# Patient Record
Sex: Female | Born: 1970 | Race: Black or African American | Hispanic: No | Marital: Single | Smoking: Never smoker
Health system: Southern US, Community
[De-identification: ages and names within clinical notes are randomized; demographics above are authoritative.]

## PROBLEM LIST (undated history)

## (undated) DIAGNOSIS — F32A Depression, unspecified: Secondary | ICD-10-CM

## (undated) DIAGNOSIS — I10 Essential (primary) hypertension: Secondary | ICD-10-CM

## (undated) DIAGNOSIS — B20 Human immunodeficiency virus [HIV] disease: Secondary | ICD-10-CM

## (undated) DIAGNOSIS — F329 Major depressive disorder, single episode, unspecified: Secondary | ICD-10-CM

## (undated) HISTORY — PX: OTHER SURGICAL HISTORY: SHX169

---

## 2011-02-11 DIAGNOSIS — N051 Unspecified nephritic syndrome with focal and segmental glomerular lesions: Secondary | ICD-10-CM

## 2011-02-11 HISTORY — DX: Unspecified nephritic syndrome with focal and segmental glomerular lesions: N05.1

## 2011-08-08 ENCOUNTER — Emergency Department: Payer: Self-pay | Admitting: Emergency Medicine

## 2014-07-21 ENCOUNTER — Emergency Department
Admission: EM | Admit: 2014-07-21 | Discharge: 2014-07-22 | Disposition: A | Discharge status: Home / Self Care | Payer: Self-pay | Attending: Student | Admitting: Student

## 2014-07-21 ENCOUNTER — Encounter: Payer: Self-pay | Admitting: Emergency Medicine

## 2014-07-21 DIAGNOSIS — Z79899 Other long term (current) drug therapy: Secondary | ICD-10-CM | POA: Insufficient documentation

## 2014-07-21 DIAGNOSIS — Z3202 Encounter for pregnancy test, result negative: Secondary | ICD-10-CM | POA: Insufficient documentation

## 2014-07-21 DIAGNOSIS — F329 Major depressive disorder, single episode, unspecified: Secondary | ICD-10-CM | POA: Insufficient documentation

## 2014-07-21 DIAGNOSIS — F32A Depression, unspecified: Secondary | ICD-10-CM

## 2014-07-21 HISTORY — DX: Major depressive disorder, single episode, unspecified: F32.9

## 2014-07-21 HISTORY — DX: Depression, unspecified: F32.A

## 2014-07-21 HISTORY — DX: Human immunodeficiency virus (HIV) disease: B20

## 2014-07-21 LAB — COMPREHENSIVE METABOLIC PANEL
ALT: 27 U/L (ref 14–54)
AST: 36 U/L (ref 15–41)
Albumin: 4.1 g/dL (ref 3.5–5.0)
Alkaline Phosphatase: 75 U/L (ref 38–126)
Anion gap: 9 (ref 5–15)
BUN: 10 mg/dL (ref 6–20)
CO2: 22 mmol/L (ref 22–32)
Calcium: 9.2 mg/dL (ref 8.9–10.3)
Chloride: 108 mmol/L (ref 101–111)
Creatinine, Ser: 1.03 mg/dL — ABNORMAL HIGH (ref 0.44–1.00)
GFR calc Af Amer: >60 mL/min (ref >60)
GFR calc non Af Amer: >60 mL/min (ref >60)
Glucose, Bld: 103 mg/dL — ABNORMAL HIGH (ref 65–99)
Potassium: 3 mmol/L — ABNORMAL LOW (ref 3.5–5.1)
Sodium: 139 mmol/L (ref 135–145)
Total Bilirubin: 0.6 mg/dL (ref 0.3–1.2)
Total Protein: 8.5 g/dL — ABNORMAL HIGH (ref 6.5–8.1)

## 2014-07-21 LAB — CBC
HCT: 41.4 % (ref 35.0–47.0)
Hemoglobin: 14.4 g/dL (ref 12.0–16.0)
MCH: 34.6 pg — ABNORMAL HIGH (ref 26.0–34.0)
MCHC: 34.9 g/dL (ref 32.0–36.0)
MCV: 99.1 fL (ref 80.0–100.0)
Platelets: 262 10*3/uL (ref 150–440)
RBC: 4.17 MIL/uL (ref 3.80–5.20)
RDW: 13.6 % (ref 11.5–14.5)
WBC: 6.9 10*3/uL (ref 3.6–11.0)

## 2014-07-21 LAB — URINE DRUG SCREEN, QUALITATIVE (ARMC ONLY)
Amphetamines, Ur Screen: NOT DETECTED
Barbiturates, Ur Screen: NOT DETECTED
Benzodiazepine, Ur Scrn: NOT DETECTED
Cannabinoid 50 Ng, Ur ~~LOC~~: NOT DETECTED
Cocaine Metabolite,Ur ~~LOC~~: NOT DETECTED
MDMA (Ecstasy)Ur Screen: NOT DETECTED
Methadone Scn, Ur: NOT DETECTED
Opiate, Ur Screen: NOT DETECTED
Phencyclidine (PCP) Ur S: NOT DETECTED
Tricyclic, Ur Screen: NOT DETECTED

## 2014-07-21 LAB — SALICYLATE LEVEL: Salicylate Lvl: <4 mg/dL (ref 2.8–30.0)

## 2014-07-21 LAB — ETHANOL: Alcohol, Ethyl (B): <5 mg/dL (ref <5)

## 2014-07-21 LAB — ACETAMINOPHEN LEVEL: Acetaminophen (Tylenol), Serum: <10 ug/mL — ABNORMAL LOW (ref 10–30)

## 2014-07-21 NOTE — ED Notes (Addendum)
BEHAVIORAL HEALTH ROUNDING  Patient sleeping: NO Patient alert and oriented: YES  Behavior appropriate: YES.; If no, describe:  Nutrition and fluids offered: YES Toileting and hygiene offered: YES Sitter present: YES q 15 min. checks  Law enforcement present: YES

## 2014-07-21 NOTE — ED Notes (Signed)
Patient has been having suicidal and homicidal thoughts for months. States that yesterday she almost hurt herself by cutting herself. Patient states that she has a hx of depression and that she has been off her behavioral health meds for years.

## 2014-07-21 NOTE — ED Notes (Signed)
BEHAVIORAL HEALTH ROUNDING  Patient sleeping: YES Patient alert and oriented: NO Behavior appropriate: NO ; If no, describe:  Nutrition and fluids offered: NO  Toileting and hygiene offered: NO Sitter present: YES  Law enforcement present: YES  

## 2014-07-21 NOTE — ED Notes (Signed)
ENVIRONMENTAL ASSESSMENT  Potentially harmful objects out of patient reach: YES  Personal belongings secured: YES  Patient dressed in hospital provided attire: YES  Plastic bags out of patient reach: YES  

## 2014-07-21 NOTE — ED Notes (Signed)
Pt assigned to appropriate care area. Pt oriented to unit/care:informed that, for their safety, care areas are designed for safety and monitored by security cameras at all times; and visiting hours explained to pt.   

## 2014-07-22 LAB — PREGNANCY, URINE: Preg Test, Ur: NEGATIVE

## 2014-07-22 MED ORDER — POTASSIUM CHLORIDE CRYS ER 20 MEQ PO TBCR
EXTENDED_RELEASE_TABLET | ORAL | Status: AC
  Administered 2014-07-22: 20 meq via ORAL
  Filled 2014-07-22: qty 1

## 2014-07-22 MED ORDER — SERTRALINE HCL 50 MG PO TABS
50.0000 mg | ORAL_TABLET | Freq: Every day | ORAL | Status: DC
Start: 2014-07-22 — End: 2014-07-22

## 2014-07-22 MED ORDER — POTASSIUM CHLORIDE CRYS ER 20 MEQ PO TBCR
20.0000 meq | EXTENDED_RELEASE_TABLET | Freq: Once | ORAL | Status: AC
  Administered 2014-07-22: 20 meq via ORAL

## 2014-07-22 NOTE — ED Notes (Signed)
BEHAVIORAL HEALTH ROUNDING Patient sleeping: No. Patient alert and oriented: yes Behavior appropriate: Yes.  ; If no, describe:  Nutrition and fluids offered: yes Toileting and hygiene offered: Yes  Sitter present: q15 minute observations and security camera monitoring Law enforcement present: Yes  ODS  

## 2014-07-22 NOTE — ED Notes (Signed)
BEHAVIORAL HEALTH ROUNDING  Patient sleeping: Yes.  Patient alert and oriented: no  Behavior appropriate: Yes. ; If no, describe:  Nutrition and fluids offered: No  Toileting and hygiene offered: No  Sitter present: no  Law enforcement present: Yes   

## 2014-07-22 NOTE — BH Assessment (Signed)
Assessment Note  Ariel Bailey is an 44 y.o. female, who presents to the ED via a friend for c/o, "I'm having suicidal thoughts; no plan; "I have been feeling like this for a few months; I ran out of my medicine; I only take my HIV medicine; I get from [facility]. "I never had treatment before; my moods are up and down; I get mad easy; it's time for me to get some help."  Axis I: Major Depression, single episode Axis II: Deferred Axis III:  Past Medical History  Diagnosis Date  . Depression   . HIV disease    Axis IV: other psychosocial or environmental problems, problems with access to health care services and problems with primary support group Axis V: 11-20 some danger of hurting self or others possible OR occasionally fails to maintain minimal personal hygiene OR gross impairment in communication  Past Medical History:  Past Medical History  Diagnosis Date  . Depression   . HIV disease     Past Surgical History  Procedure Laterality Date  . Pelvic mass removal      Family History: No family history on file.  Social History:  reports that she has never smoked. She does not have any smokeless tobacco history on file. She reports that she does not drink alcohol or use illicit drugs.  Additional Social History:     CIWA: CIWA-Ar BP: (!) 140/91 mmHg Pulse Rate: 68 COWS:    Allergies: No Known Allergies  Home Medications:  (Not in a hospital admission)  OB/GYN Status:  No LMP recorded. Patient is not currently having periods (Reason: IUD).  General Assessment Data Location of Assessment: [facility] ED TTS Assessment: In system Is this a Tele or Face-to-Face Assessment?: Face-to-Face Is this an Initial Assessment or a Re-assessment for this encounter?: Re-Assessment Marital status: Single Maiden name: none Is patient pregnant?: No Pregnancy Status: No Living Arrangements: Spouse/significant other Can pt return to current living arrangement?: Yes Admission  Status: Involuntary Is patient capable of signing voluntary admission?: Yes Referral Source: Self/Family/Friend Insurance type: none  Medical Screening Exam [facility] Walk-in ONLY) Medical Exam completed: Yes  Crisis Care Plan Living Arrangements: Spouse/significant other Name of Psychiatrist: none Name of Therapist: none  Education Status Is patient currently in school?: No Current Grade: n/a Highest grade of school patient has completed: 12th Name of school: n/a Contact person: roommate  Risk to self with the past 6 months Suicidal Ideation: Yes-Currently Present Has patient been a risk to self within the past 6 months prior to admission? : No Suicidal Intent: Yes-Currently Present Has patient had any suicidal intent within the past 6 months prior to admission? : No Is patient at risk for suicide?: Yes Suicidal Plan?: No Has patient had any suicidal plan within the past 6 months prior to admission? : No Access to Means: No What has been your use of drugs/alcohol within the last 12 months?: none Previous Attempts/Gestures: No How many times?: 0 Other Self Harm Risks: helpless; hopeless Triggers for Past Attempts: None known Intentional Self Injurious Behavior: None Family Suicide History: No Recent stressful life event(s): Conflict (Comment) Persecutory voices/beliefs?: No Depression: Yes Depression Symptoms: Despondent, Tearfulness, Insomnia, Loss of interest in usual pleasures Substance abuse history and/or treatment for substance abuse?: No Suicide prevention information given to non-admitted patients: Yes  Risk to Others within the past 6 months Homicidal Ideation: No Does patient have any lifetime risk of violence toward others beyond the six months prior to admission? : No Thoughts  of Harm to Others: No Current Homicidal Intent: No Current Homicidal Plan: No Access to Homicidal Means: No Identified Victim: none History of harm to others?: No Assessment of  Violence: On admission Violent Behavior Description: none Does patient have access to weapons?: No Criminal Charges Pending?: No Does patient have a court date: No Is patient on probation?: No  Psychosis Hallucinations: None noted Delusions: None noted  Mental Status Report Appearance/Hygiene: In scrubs, Disheveled Eye Contact: Poor Motor Activity: Unremarkable Speech: Slow, Soft Level of Consciousness: Sleeping Mood: Helpless, Sad Affect: Depressed, Sad Anxiety Level: None Thought Processes: Coherent, Circumstantial Judgement: Partial Orientation: Person, Place, Situation Obsessive Compulsive Thoughts/Behaviors: None  Cognitive Functioning Concentration: Fair Memory: Recent Intact, Remote Intact IQ: Average Insight: Fair Impulse Control: Fair Appetite: Fair Weight Loss: 0 Weight Gain: 0 Sleep: Decreased Total Hours of Sleep: 4 Vegetative Symptoms: None  ADLScreening [facility] Assessment Services) Patient's cognitive ability adequate to safely complete daily activities?: Yes Patient able to express need for assistance with ADLs?: Yes Independently performs ADLs?: Yes (appropriate for developmental age)  Prior Inpatient Therapy Prior Inpatient Therapy: No  Prior Outpatient Therapy Prior Outpatient Therapy: No Does patient have an ACCT team?: No Does patient have Intensive In-House Services?  : No Does patient have [facility] services? : No Does patient have P4CC services?: No  ADL Screening (condition at time of admission) Patient's cognitive ability adequate to safely complete daily activities?: Yes Patient able to express need for assistance with ADLs?: Yes Independently performs ADLs?: Yes (appropriate for developmental age)       Abuse/Neglect Assessment (Assessment to be complete while patient is alone) Physical Abuse: Denies Verbal Abuse: Denies Sexual Abuse: Denies Exploitation of patient/patient's resources: Denies Self-Neglect: Denies Values /  Beliefs Cultural Requests During Hospitalization: None Spiritual Requests During Hospitalization: None Consults Spiritual Care Consult Needed: No Social Work Consult Needed: No Merchant navy officer (For Healthcare) Does patient have an advance directive?: No Would patient like information on creating an advanced directive?: Yes English as a second language teacher given    Additional Information 1:1 In Past 12 Months?: No CIRT Risk: No Elopement Risk: No Does patient have medical clearance?: Yes  Child/Adolescent Assessment Running Away Risk: Denies Bed-Wetting: Denies Destruction of Property: Denies Cruelty to Animals: Denies Stealing: Denies Rebellious/Defies Authority: Denies Satanic Involvement: Denies Archivist: Denies Problems at [employer]: Denies Gang Involvement: Denies  Disposition:  Disposition Initial Assessment Completed for this Encounter: Yes Disposition of Patient: Referred to (Psych MD to see) Patient referred to: Other (Comment) (Consult)  On Site Evaluation by:   Reviewed with Physician:    Dwan Bolt 07/22/2014 1:36 AM

## 2014-07-22 NOTE — ED Notes (Signed)
Pt observed lying in bed with her TV on   Appropriate to stimulation  No verbalized needs or concerns at this time  NAD assessed  Continue to monitor

## 2014-07-22 NOTE — ED Provider Notes (Signed)
-----------------------------------------   4:56 PM on 07/22/2014 -----------------------------------------  The patient has been seen and evaluated by psychiatry, they believe that the patient is safe for discharge home at this time. Patient will be discharged home with normal psychiatric return precautions.  Minna Antis, MD 07/22/14 [phone]

## 2014-07-22 NOTE — ED Notes (Signed)
BEHAVIORAL HEALTH ROUNDING Patient sleeping: No. Patient alert and oriented: yes Behavior appropriate: Yes.  ; If no, describe:  Nutrition and fluids offered: Yes  Toileting and hygiene offered: Yes  Sitter present: yes Law enforcement present: Yes  

## 2014-07-22 NOTE — ED Notes (Signed)
Patient is going to D/C to home

## 2014-07-22 NOTE — ED Notes (Signed)

## 2014-07-22 NOTE — ED Notes (Signed)

## 2014-07-22 NOTE — ED Notes (Signed)
Pt to discharge to home   1/1 bags of belongings returned to her and she verbalized that she received back all belongings that she came here with   Discharge instructions reviewed with her and she verbalized agreement and understanding

## 2014-07-22 NOTE — ED Notes (Addendum)
Supper provided along with an extra drink  Pt observed with no unusual behavior  Appropriate to stimulation  No verbalized needs or concerns at this time  NAD assessed  Continue to monitor 

## 2014-07-22 NOTE — ED Notes (Signed)

## 2014-07-22 NOTE — Discharge Instructions (Signed)
Depression Depression is feeling sad, low, down in the dumps, blue, gloomy, or empty. In general, there are two kinds of depression:  Normal sadness or grief. This can happen after something upsetting. It often goes away on its own within 2 weeks. After losing a loved one (bereavement), normal sadness and grief may last longer than two weeks. It usually gets better with time.  Clinical depression. This kind lasts longer than normal sadness or grief. It keeps you from doing the things you normally do in life. It is often hard to function at home, work, or at school. It may affect your relationships with others. Treatment is often needed. GET HELP RIGHT AWAY IF:  You have thoughts about hurting yourself or others.  You lose touch with reality (psychotic symptoms). You may:  See or hear things that are not real.  Have untrue beliefs about your life or people around you.  Your medicine is giving you problems. MAKE SURE YOU:  Understand these instructions.  Will watch your condition.  Will get help right away if you are not doing well or get worse. Document Released: 03/01/2010 Document Revised: 06/13/2013 Document Reviewed: 05/29/2011 [facility] Patient Information 2015 [place], [place]. This information is not intended to replace advice given to you by your health care provider. Make sure you discuss any questions you have with your health care provider.   Please return to the emergency department if you're having any thoughts of hurting herself or anyone else that we may attempt to help you.

## 2014-07-22 NOTE — ED Notes (Signed)
ED BHU PLACEMENT JUSTIFICATION Is the patient under IVC or is there intent for IVC: Yes.   Is the patient medically cleared: Yes.   Is there vacancy in the ED BHU: Yes.   Is the population mix appropriate for patient: Yes.   Is the patient awaiting placement in inpatient or outpatient setting: Yes.   Has the patient had a psychiatric consult: No. Survey of unit performed for contraband, proper placement and condition of furniture, tampering with fixtures in bathroom, shower, and each patient room: Yes.  ; Findings:  APPEARANCE/BEHAVIOR calm, cooperative and adequate rapport can be established NEURO ASSESSMENT Orientation: time, place and person Hallucinations: No.None noted (Hallucinations) Speech: Normal Gait: normal RESPIRATORY ASSESSMENT Normal expansion.  Clear to auscultation.  No rales, rhonchi, or wheezing. CARDIOVASCULAR ASSESSMENT regular rate and rhythm, S1, S2 normal, no murmur, click, rub or gallop GASTROINTESTINAL ASSESSMENT soft, nontender, BS WNL, no r/g EXTREMITIES normal strength, tone, and muscle mass PLAN OF CARE Provide calm/safe environment. Vital signs assessed twice daily. ED BHU Assessment once each 12-hour shift. Collaborate with intake RN daily or as condition indicates. Assure the ED provider has rounded once each shift. Provide and encourage hygiene. Provide redirection as needed. Assess for escalating behavior; address immediately and inform ED provider.  Assess family dynamic and appropriateness for visitation as needed: Yes.  ; If necessary, describe findings:  Educate the patient/family about BHU procedures/visitation: Yes.  ; If necessary, describe findings:  

## 2014-07-22 NOTE — ED Notes (Signed)
Pt is currently getting in to the shower

## 2014-07-22 NOTE — ED Notes (Signed)
Patient assigned to appropriate care area. Patient oriented to unit/care area: Informed that, for their safety, care areas are designed for safety and monitored by security cameras at all times; and visiting hours explained to patient. Patient verbalizes understanding, and verbal contract for safety obtained. 

## 2014-07-22 NOTE — Consult Note (Signed)
[facility] Consult   Reason for Consult:  Follow up Referring Physician:  ER Patient Identification: Ariel Bailey MRN:  [id number] Principal Diagnosis: MDD single episode Diagnosis:  There are no active problems to display for this patient.   Total Time spent with patient: 45 minutes  Subjective:   Ariel Bailey is a 44 y.o. female patient admitted with a long H/O feeling low and down about life in general. Pt lives with a room mate who called for help.  HPI:  Feeling low and down and no previous inpt to Psychiatry and denies alcohol or drug abuse. HPI Elements:     Past Medical History:  Past Medical History  Diagnosis Date  . Depression   . HIV disease     Past Surgical History  Procedure Laterality Date  . Pelvic mass removal     Family History: No family history on file. Social History:  History  Alcohol Use No     History  Drug Use No    History   Social History  . Marital Status: Single    Spouse Name: N/A  . Number of Children: N/A  . Years of Education: N/A   Social History Main Topics  . Smoking status: Never Smoker   . Smokeless tobacco: Not on file  . Alcohol Use: No  . Drug Use: No  . Sexual Activity: Not on file   Other Topics Concern  . None   Social History Narrative  . None   Additional Social History:                          Allergies:  No Known Allergies  Labs:  Results for orders placed or performed during the hospital encounter of 07/21/14 (from the past 48 hour(s))  Urine Drug Screen, Qualitative ([facility] only)     Status: None   Collection Time: 07/21/14  6:20 PM  Result Value Ref Range   Tricyclic, Ur Screen NONE DETECTED NONE DETECTED   Amphetamines, Ur Screen NONE DETECTED NONE DETECTED   MDMA (Ecstasy)Ur Screen NONE DETECTED NONE DETECTED   Cocaine Metabolite,Ur [place] NONE DETECTED NONE DETECTED   Opiate, Ur Screen NONE DETECTED NONE DETECTED   Phencyclidine  (PCP) Ur S NONE DETECTED NONE DETECTED   Cannabinoid 50 Ng, Ur  NONE DETECTED NONE DETECTED   Barbiturates, Ur Screen NONE DETECTED NONE DETECTED   Benzodiazepine, Ur Scrn NONE DETECTED NONE DETECTED   Methadone Scn, Ur NONE DETECTED NONE DETECTED    Comment: (NOTE) 272  Tricyclics, urine               Cutoff 1000 ng/mL 200  Amphetamines, urine             Cutoff 1000 ng/mL 300  MDMA (Ecstasy), urine           Cutoff 500 ng/mL 400  Cocaine Metabolite, urine       Cutoff 300 ng/mL 500  Opiate, urine                   Cutoff 300 ng/mL 600  Phencyclidine (PCP), urine      Cutoff 25 ng/mL 700  Cannabinoid, urine              Cutoff 50 ng/mL 800  Barbiturates, urine             Cutoff 200 ng/mL 900  Benzodiazepine, urine  Cutoff 200 ng/mL 1000 Methadone, urine                Cutoff 300 ng/mL 1100 1200 The urine drug screen provides only a preliminary, unconfirmed 1300 analytical test result and should not be used for non-medical 1400 purposes. Clinical consideration and professional judgment should 1500 be applied to any positive drug screen result due to possible 1600 interfering substances. A more specific alternate chemical method 1700 must be used in order to obtain a confirmed analytical result.  1800 Gas chromato graphy / mass spectrometry (GC/MS) is the preferred 1900 confirmatory method.   Pregnancy, urine     Status: None   Collection Time: 07/21/14  6:20 PM  Result Value Ref Range   Preg Test, Ur NEGATIVE NEGATIVE  Acetaminophen level     Status: Abnormal   Collection Time: 07/21/14  7:21 PM  Result Value Ref Range   Acetaminophen (Tylenol), Serum <10 (L) 10 - 30 ug/mL    Comment:        THERAPEUTIC CONCENTRATIONS VARY SIGNIFICANTLY. A RANGE OF 10-30 ug/mL MAY BE AN EFFECTIVE CONCENTRATION FOR MANY PATIENTS. HOWEVER, SOME ARE BEST TREATED AT CONCENTRATIONS OUTSIDE THIS RANGE. ACETAMINOPHEN CONCENTRATIONS >150 ug/mL AT 4 HOURS AFTER INGESTION AND >50 ug/mL  AT 12 HOURS AFTER INGESTION ARE OFTEN ASSOCIATED WITH TOXIC REACTIONS.   CBC     Status: Abnormal   Collection Time: 07/21/14  7:21 PM  Result Value Ref Range   WBC 6.9 3.6 - 11.0 K/uL   RBC 4.17 3.80 - 5.20 MIL/uL   Hemoglobin 14.4 12.0 - 16.0 g/dL   HCT 41.4 35.0 - 47.0 %   MCV 99.1 80.0 - 100.0 fL   MCH 34.6 (H) 26.0 - 34.0 pg   MCHC 34.9 32.0 - 36.0 g/dL   RDW 13.6 11.5 - 14.5 %   Platelets 262 150 - 440 K/uL  Comprehensive metabolic panel     Status: Abnormal   Collection Time: 07/21/14  7:21 PM  Result Value Ref Range   Sodium 139 135 - 145 mmol/L   Potassium 3.0 (L) 3.5 - 5.1 mmol/L   Chloride 108 101 - 111 mmol/L   CO2 22 22 - 32 mmol/L   Glucose, Bld 103 (H) 65 - 99 mg/dL   BUN 10 6 - 20 mg/dL   Creatinine, Ser 1.03 (H) 0.44 - 1.00 mg/dL   Calcium 9.2 8.9 - 10.3 mg/dL   Total Protein 8.5 (H) 6.5 - 8.1 g/dL   Albumin 4.1 3.5 - 5.0 g/dL   AST 36 15 - 41 U/L   ALT 27 14 - 54 U/L   Alkaline Phosphatase 75 38 - 126 U/L   Total Bilirubin 0.6 0.3 - 1.2 mg/dL   GFR calc non Af Amer >60 >60 mL/min   GFR calc Af Amer >60 >60 mL/min    Comment: (NOTE) The eGFR has been calculated using the CKD EPI equation. This calculation has not been validated in all clinical situations. eGFR's persistently <60 mL/min signify possible Chronic Kidney Disease.    Anion gap 9 5 - 15  Ethanol (ETOH)     Status: None   Collection Time: 07/21/14  7:21 PM  Result Value Ref Range   Alcohol, Ethyl (B) <5 <5 mg/dL    Comment:        LOWEST DETECTABLE LIMIT FOR SERUM ALCOHOL IS 5 mg/dL FOR MEDICAL PURPOSES ONLY   Salicylate level     Status: None   Collection Time: 07/21/14  7:21 PM  Result Value Ref Range   Salicylate Lvl <5.0 2.8 - 30.0 mg/dL    Vitals: Blood pressure 140/91, pulse 68, temperature 97.7 F (36.5 C), temperature source Oral, resp. rate 12, height _0  (1.575 m), weight 76.204 kg (168 lb), SpO2 93 %.  Risk to Self: Suicidal Ideation: Yes-Currently Present Suicidal  Intent: Yes-Currently Present Is patient at risk for suicide?: Yes Suicidal Plan?: No Access to Means: No What has been your use of drugs/alcohol within the last 12 months?: none How many times?: 0 Other Self Harm Risks: helpless; hopeless Triggers for Past Attempts: None known Intentional Self Injurious Behavior: None Risk to Others: Homicidal Ideation: No Thoughts of Harm to Others: No Current Homicidal Intent: No Current Homicidal Plan: No Access to Homicidal Means: No Identified Victim: none History of harm to others?: No Assessment of Violence: On admission Violent Behavior Description: none Does patient have access to weapons?: No Criminal Charges Pending?: No Does patient have a court date: No Prior Inpatient Therapy: Prior Inpatient Therapy: No Prior Outpatient Therapy: Prior Outpatient Therapy: No Does patient have an ACCT team?: No Does patient have Intensive In-House Services?  : No Does patient have [facility] services? : No Does patient have P4CC services?: No  No current facility-administered medications for this encounter.   Current Outpatient Prescriptions  Medication Sig Dispense Refill  . TRIUMEQ 600-50-300 MG TABS Take 1 tablet by mouth daily.  11    Musculoskeletal: Strength & Muscle Tone: within normal limits Gait & Station: normal Patient leans: N/A  Psychiatric Specialty Exam: Physical Exam  Review of Systems  Constitutional: Negative.   HENT: Negative.   Eyes: Negative.   Respiratory: Negative.   Cardiovascular: Negative.   Gastrointestinal: Negative.   Genitourinary: Negative.   Musculoskeletal: Negative.   Skin: Negative.   Endo/Heme/Allergies: Negative.   Psychiatric/Behavioral: Positive for depression.  All other systems reviewed and are negative.   Blood pressure 140/91, pulse 68, temperature 97.7 F (36.5 C), temperature source Oral, resp. rate 12, height _1  (1.575 m), weight 76.204 kg (168 lb), SpO2 93 %.Body mass index is 30.72  kg/(m^2).  General Appearance: Casual  Eye Contact::  Fair  Speech:  Clear and Coherent  Volume:  Normal  Mood:  Anxious and Depressed  Affect:  Appropriate  Thought Process:  Coherent  Orientation:  Full (Time, Place, and Person)  Thought Content:  WDL  Suicidal Thoughts:  No  Homicidal Thoughts:  No  Memory:  Immediate;   Fair Recent;   Fair Remote;   Fair adequate  Judgement:  Fair  Insight:  Fair  Psychomotor Activity:  Normal  Concentration:  Fair  Recall:  [employer] [place]  Language: Fair  Akathisia:  No  Handed:  Right  AIMS (if indicated):     Assets:  Wellsite geologist  ADL's:  Intact  Cognition: WNL  Sleep:      Medical Decision Making: Established Problem, Stable/Improving (1)  Treatment Plan Summary: Plan start pt on Zoloft 50 mgs po daily with good and D/C pt with follow up at Out pt [facility].  Plan:  No evidence of imminent risk to self or others at present.   Disposition: as above.  Dewain Penning 07/22/2014 4:25 PM

## 2014-07-22 NOTE — ED Notes (Signed)
[place] md in consulting with her at this time   Pt observed with no unusual behavior  Appropriate to stimulation  No verbalized needs or concerns at this time  NAD assessed  Continue to monitor

## 2014-07-22 NOTE — ED Notes (Signed)
BEHAVIORAL HEALTH ROUNDING Patient sleeping: Yes.   Patient alert and oriented: eyes closed  Appears asleep Behavior appropriate: Yes.  ; If no, describe:  Nutrition and fluids offered: Yes  Toileting and hygiene offered: sleeping Sitter present: q 15 minute observations and security camera monitoring Law enforcement present: yes  ODS 

## 2014-07-22 NOTE — ED Notes (Signed)
Lunch provided along with an extra drink  Pt observed with no unusual behavior  Appropriate to stimulation  No verbalized needs or concerns at this time  NAD assessed  Continue to monitor 

## 2014-07-22 NOTE — ED Provider Notes (Signed)
[facility] Emergency Department Provider Note  ____________________________________________  Time seen: Approximately 12:46 AM  I have reviewed the triage vital signs and the nursing notes.   HISTORY  Chief Complaint Suicidal    HPI Ariel Bailey is a 44 y.o. female with history of depression and HIV who presents for evaluation of worsening depression, gradual onset, intermittent and worsening for the past several months. Currently her symptoms are severe. Tonight she has been having thoughts of wanting to cut herself although she doesn't really want to kill herself. She told this to her friend and her friend recommended she come to the emergency department. No modifying factors. She denies any acute medical complaints. No recent illness including no coughing, vomiting, diarrhea, chest pain or difficulty breathing. No homicidal ideation or audiovisual hallucinations.   Past Medical History  Diagnosis Date  . Depression   . HIV disease     There are no active problems to display for this patient.   Past Surgical History  Procedure Laterality Date  . Pelvic mass removal      Current Outpatient Rx  Name  Route  Sig  Dispense  Refill  . TRIUMEQ 600-50-300 MG TABS   Oral   Take 1 tablet by mouth daily.      11     Dispense as written.     Allergies Review of patient's allergies indicates no known allergies.  No family history on file.  Social History History  Substance Use Topics  . Smoking status: Never Smoker   . Smokeless tobacco: Not on file  . Alcohol Use: No    Review of Systems Constitutional: No fever/chills Eyes: No visual changes. ENT: No sore throat. Cardiovascular: Denies chest pain. Respiratory: Denies shortness of breath. Gastrointestinal: No abdominal pain.  No nausea, no vomiting.  No diarrhea.  No constipation. Genitourinary: Negative for dysuria. Musculoskeletal: Negative for back pain. Skin:  Negative for rash. Neurological: Negative for headaches, focal weakness or numbness.  10-point ROS otherwise negative.  ____________________________________________   PHYSICAL EXAM:  VITAL SIGNS: ED Triage Vitals  Enc Vitals Group     BP 07/21/14 1852 165/104 mmHg     Pulse Rate 07/21/14 1852 66     Resp 07/21/14 1852 14     Temp 07/21/14 1852 98.2 F (36.8 C)     Temp Source 07/21/14 1852 Oral     SpO2 07/21/14 1852 95 %     Weight 07/21/14 1852 168 lb (76.204 kg)     Height 07/21/14 1852  (1.575 m)     Head Cir --      Peak Flow --      Pain Score --      Pain Loc --      Pain Edu? --      Excl. in GC? --     Constitutional: Alert and oriented. Well appearing and in no acute distress. Eyes: Conjunctivae are normal. EOMI. Head: Atraumatic. Nose: No congestion/rhinnorhea. Mouth/Throat: Mucous membranes are moist.  Oropharynx non-erythematous. Neck: No stridor.  Cardiovascular: Normal rate, regular rhythm. Grossly normal heart sounds.  Good peripheral circulation. Respiratory: Normal respiratory effort.  No retractions. Lungs CTAB. Gastrointestinal: Soft and nontender. No distention. No abdominal bruits. No CVA tenderness. Genitourinary: deferred Musculoskeletal: No lower extremity tenderness nor edema.  No joint effusions. Neurologic:  Normal speech and language. No gross focal neurologic deficits are appreciated. Speech is normal. No gait instability. Skin:  Skin is warm, dry and intact. No rash noted. Psychiatric:  Mood and affect are normal. Speech and behavior are normal.  ____________________________________________   LABS (all labs ordered are listed, but only abnormal results are displayed)  Labs Reviewed  ACETAMINOPHEN LEVEL - Abnormal; Notable for the following:    Acetaminophen (Tylenol), Serum <10 (*)    All other components within normal limits  CBC - Abnormal; Notable for the following:    MCH 34.6 (*)    All other components within normal  limits  COMPREHENSIVE METABOLIC PANEL - Abnormal; Notable for the following:    Potassium 3.0 (*)    Glucose, Bld 103 (*)    Creatinine, Ser 1.03 (*)    Total Protein 8.5 (*)    All other components within normal limits  ETHANOL  SALICYLATE LEVEL  URINE DRUG SCREEN, QUALITATIVE ([facility] ONLY)  PREGNANCY, URINE   ____________________________________________  EKG  none ____________________________________________  RADIOLOGY  none ____________________________________________   PROCEDURES  Procedure(s) performed: None  Critical Care performed: No  ____________________________________________   INITIAL IMPRESSION / ASSESSMENT AND PLAN / ED COURSE  Pertinent labs & imaging results that were available during my care of the patient were reviewed by me and considered in my medical decision making (see chart for details).  Ariel Bailey is a 44 y.o. female with history of depression and HIV who presents for evaluation of worsening depression, gradual onset, intermittent and worsening for the past several months. On exam, she is generally well-appearing and in no acute distress. Vital signs stable, she is afebrile. She has no acute medical complaints and benign physical examination. She continues to have thoughts of wanting to harm herself therefore will place involuntary commitment, consult psychiatry and behavioral medicine. Labs reviewed and are generally unremarkable other than mild hypokalemia - will replete orally. Medically cleared. ____________________________________________   FINAL CLINICAL IMPRESSION(S) / ED DIAGNOSES  Final diagnoses:  Depression      Gayla Doss, MD 07/22/14 [phone]

## 2016-08-29 ENCOUNTER — Emergency Department
Admission: EM | Admit: 2016-08-29 | Discharge: 2016-08-29 | Disposition: A | Discharge status: Home / Self Care | Payer: Self-pay | Attending: Emergency Medicine | Admitting: Emergency Medicine

## 2016-08-29 ENCOUNTER — Encounter: Payer: Self-pay | Admitting: Emergency Medicine

## 2016-08-29 DIAGNOSIS — B20 Human immunodeficiency virus [HIV] disease: Secondary | ICD-10-CM | POA: Insufficient documentation

## 2016-08-29 DIAGNOSIS — Z79899 Other long term (current) drug therapy: Secondary | ICD-10-CM | POA: Insufficient documentation

## 2016-08-29 DIAGNOSIS — K644 Residual hemorrhoidal skin tags: Secondary | ICD-10-CM | POA: Insufficient documentation

## 2016-08-29 MED ORDER — LIDOCAINE (ANORECTAL) 5 % EX GEL: 1.0000 | Freq: Three times a day (TID) | CUTANEOUS | 0 refills | Status: AC | PRN

## 2016-08-29 MED ORDER — HYDROCORTISONE ACETATE 25 MG RE SUPP: 25.0000 mg | Freq: Two times a day (BID) | RECTAL | 0 refills | Status: DC | PRN

## 2016-08-29 MED ORDER — HYDROCODONE-IBUPROFEN 5-200 MG PO TABS: 1.0000 | ORAL_TABLET | Freq: Four times a day (QID) | ORAL | 0 refills | Status: DC | PRN

## 2016-08-29 NOTE — Discharge Instructions (Signed)
Call for an appointment to follow up with Dr. Tonita CongWoodham above or follow up with your provider at [facility] to assist you with a referral for continued care.

## 2016-08-29 NOTE — ED Triage Notes (Signed)
Pt arrived via POV from home with reports of hemorrhoid pain.  Pt states she has had problems with hemorrhoids in the past. Pt states she has tried OTC products with no relief. Pt states she has been doing warm baths with epsom salt which provides some relief.

## 2016-08-29 NOTE — ED Notes (Signed)
Pt c/o hemorrhoid pain, states when she has bowel movement she also notices blood in her stools. Pt states no OTC methods have provided relief. Pt is alert and oriented at this time.

## 2016-08-29 NOTE — ED Provider Notes (Signed)
[facility] Emergency Department Provider Note   ____________________________________________   I have reviewed the triage vital signs and the nursing notes.   HISTORY  Chief Complaint Hemorrhoids    HPI Ariel Bailey is a 46 y.o. female presents to the emergency department with rectal pain related to external hemorrhoid that developed over the last week. Patient reports experiencing straining with bowel movements that led to the development of the external hemorrhoid. Patient reports also right red blood with bowel movements that resolves once she completes movement. Patient felt bleeding occurred from firm stool and constipation. Patient reports past history of external hemorrhoids and went symptoms resolved however she did not have the hemorrhoids surgically managed. Patient has attempted self treatment with OTC medications without relief. Patient is followed at [facility] infectious disease clinic for HIV. She is compliant with all medication regimen and attends all scheduled appointment. Patient denies fever, chills, headache, vision changes, chest pain, chest tightness, shortness of breath, abdominal pain, nausea and vomiting.  Past Medical History:  Diagnosis Date  . Depression   . HIV disease (HCC)     There are no active problems to display for this patient.   Past Surgical History:  Procedure Laterality Date  . Pelvic Mass Removal      Prior to Admission medications   Medication Sig Start Date End Date Taking? Authorizing Provider  hydrocodone-ibuprofen (VICOPROFEN) 5-200 MG tablet Take 1 tablet by mouth every 6 (six) hours as needed for pain. 08/29/16   Khyleigh Furney M, PA-C  hydrocortisone (ANUSOL-HC) 25 MG suppository Place 1 suppository (25 mg total) rectally 2 (two) times daily as needed for hemorrhoids or anal itching. 08/29/16   Nakeitha Milligan M, PA-C  Lidocaine, Anorectal, 5 % GEL Apply 1 Bottle topically 3 (three) times daily  as needed. 08/29/16 09/06/16  Letoya Stallone M, PA-C  TRIUMEQ 600-50-300 MG TABS Take 1 tablet by mouth daily. 07/07/14   [provider]    Allergies Dapsone and Penicillins  History reviewed. No pertinent family history.  Social History Social History  Substance Use Topics  . Smoking status: Never Smoker  . Smokeless tobacco: Never Used  . Alcohol use No    Review of Systems Constitutional: Negative for fever/chills Eyes: No visual changes. ENT:  Negative for sore throat and for difficulty swallowing Cardiovascular: Denies chest pain. Respiratory: Denies cough. Denies shortness of breath. Gastrointestinal: No abdominal pain.  No nausea, vomiting, diarrhea. Genitourinary: Negative for dysuria. Rectal pain secondary to hemorrhoids. Musculoskeletal: Negative for back pain. Skin: Negative for rash. Neurological: Negative for headaches.  ____________________________________________   PHYSICAL EXAM:  VITAL SIGNS: ED Triage Vitals  Enc Vitals Group     BP 08/29/16 1506 117/78     Pulse Rate 08/29/16 1506 64     Resp 08/29/16 1506 18     Temp 08/29/16 1506 98 F (36.7 C)     Temp Source 08/29/16 1506 Oral     SpO2 08/29/16 1506 100 %     Weight 08/29/16 1510 161 lb (73 kg)     Height 08/29/16 1510 5\' 2"  (1.575 m)     Head Circumference --      Peak Flow --      Pain Score 08/29/16 1508 10     Pain Loc --      Pain Edu? --      Excl. in GC? --     Constitutional: Alert and oriented. Well appearing and in no acute distress.  Head: Normocephalic  and atraumatic. Eyes: Conjunctivae are normal. PERRL.  Cardiovascular: Normal rate, regular rhythm. Normal distal pulses. Respiratory: Normal respiratory effort. No wheezes/rales/rhonchi. Lungs CTAB with no W/R/R. Gastrointestinal: Soft and nontender. No distention. Genitourinary: Negative for dysuria. Rectal pain secondary to hemorrhoids. Single inflamed external hemorrhoid along the right perirectal region. Not  thrombosed. No visible bleeding noted on exam.  Musculoskeletal: Nontender with normal range of motion in all extremities. Neurologic: Normal speech and language.  Skin:  Skin is warm, dry and intact. No rash noted. Psychiatric: Mood and affect are normal.  ____________________________________________   LABS (all labs ordered are listed, but only abnormal results are displayed)  Labs Reviewed - No data to display ____________________________________________  EKG None ____________________________________________  RADIOLOGY None ____________________________________________   PROCEDURES  Procedure(s) performed: No    Critical Care performed: no ____________________________________________   INITIAL IMPRESSION / ASSESSMENT AND PLAN / ED COURSE  Pertinent labs & imaging results that were available during my care of the patient were reviewed by me and considered in my medical decision making (see chart for details).  Patient presented with rectal pain and bleeding related to hemorrhoids. History and physical exam are reassuring symptoms are consistent with external hemorrhoid not thrombosed. Patient will be prescribed lidocaine gel, hydrocortisone suppository and vicoprofen for symptom management. Recommended high-fiber and Miralax for constipation.  Provided local surgery referral for continued management and recommended patient follow-up with her provider at [facility] for a referral through the [facility] system if she preferred. Patient informed of clinical course, understand medical decision-making process, and agree with plan.  Patient was advised to follow up with surgery and was also advised to return to the emergency department for symptoms that change or worsen.    If controlled substance prescribed during this visit, 12 month history viewed on the [facility] prior to issuing an initial prescription for Schedule II or III  opiod. ____________________________________________   FINAL CLINICAL IMPRESSION(S) / ED DIAGNOSES  Final diagnoses:  Inflamed external hemorrhoid       NEW MEDICATIONS STARTED DURING THIS VISIT:  Discharge Medication List as of 08/29/2016  4:17 PM    START taking these medications   Details  hydrocodone-ibuprofen (VICOPROFEN) 5-200 MG tablet Take 1 tablet by mouth every 6 (six) hours as needed for pain., Starting Fri 08/29/2016, Print    hydrocortisone (ANUSOL-HC) 25 MG suppository Place 1 suppository (25 mg total) rectally 2 (two) times daily as needed for hemorrhoids or anal itching., Starting Fri 08/29/2016, Print    Lidocaine, Anorectal, 5 % GEL Apply 1 Bottle topically 3 (three) times daily as needed., Starting Fri 08/29/2016, Until Sat 09/06/2016, Print         Note:  This document was prepared using Dragon voice recognition software and may include unintentional dictation errors.    Clois ComberLittle, Latiqua Daloia M, PA-C 08/29/16 1733    Emily FilbertWilliams, Jonathan E, MD 08/29/16 [phone]

## 2016-09-17 ENCOUNTER — Telehealth: Payer: Self-pay | Admitting: General Surgery

## 2016-09-17 NOTE — Telephone Encounter (Signed)
I have called patient to make an appointment per referral from the ED at [facility] for ED Follow-up (08/29/16): Hemorrhoids. Patient has made an appointment with her PCP for 09/18/16. I have advised her if she needs to be referred to us in the future that we are always available.

## 2019-05-14 ENCOUNTER — Ambulatory Visit: Payer: Self-pay | Attending: Internal Medicine

## 2019-06-20 ENCOUNTER — Encounter (HOSPITAL_COMMUNITY): Payer: Self-pay | Admitting: Emergency Medicine

## 2019-06-20 ENCOUNTER — Emergency Department (HOSPITAL_COMMUNITY): Payer: BC Managed Care – PPO

## 2019-06-20 ENCOUNTER — Emergency Department (HOSPITAL_COMMUNITY)
Admission: EM | Admit: 2019-06-20 | Discharge: 2019-06-21 | Disposition: A | Discharge status: Home / Self Care | Payer: BC Managed Care – PPO | Attending: Emergency Medicine | Admitting: Emergency Medicine

## 2019-06-20 DIAGNOSIS — R030 Elevated blood-pressure reading, without diagnosis of hypertension: Secondary | ICD-10-CM | POA: Insufficient documentation

## 2019-06-20 DIAGNOSIS — Z21 Asymptomatic human immunodeficiency virus [HIV] infection status: Secondary | ICD-10-CM | POA: Insufficient documentation

## 2019-06-20 DIAGNOSIS — Y929 Unspecified place or not applicable: Secondary | ICD-10-CM | POA: Diagnosis not present

## 2019-06-20 DIAGNOSIS — Y999 Unspecified external cause status: Secondary | ICD-10-CM | POA: Diagnosis not present

## 2019-06-20 DIAGNOSIS — Y939 Activity, unspecified: Secondary | ICD-10-CM | POA: Diagnosis not present

## 2019-06-20 DIAGNOSIS — W540XXA Bitten by dog, initial encounter: Secondary | ICD-10-CM | POA: Diagnosis not present

## 2019-06-20 DIAGNOSIS — S51851A Open bite of right forearm, initial encounter: Secondary | ICD-10-CM | POA: Insufficient documentation

## 2019-06-20 DIAGNOSIS — Z2914 Encounter for prophylactic rabies immune globin: Secondary | ICD-10-CM | POA: Insufficient documentation

## 2019-06-20 DIAGNOSIS — Z79899 Other long term (current) drug therapy: Secondary | ICD-10-CM | POA: Insufficient documentation

## 2019-06-20 DIAGNOSIS — Z23 Encounter for immunization: Secondary | ICD-10-CM | POA: Diagnosis not present

## 2019-06-20 MED ORDER — RABIES IMMUNE GLOBULIN 150 UNIT/ML IM INJ
20.0000 [IU]/kg | INJECTION | Freq: Once | INTRAMUSCULAR | Status: AC
  Administered 2019-06-20: 1650 [IU] via INTRAMUSCULAR
  Filled 2019-06-20: qty 11

## 2019-06-20 MED ORDER — OXYCODONE-ACETAMINOPHEN 5-325 MG PO TABS
1.0000 | ORAL_TABLET | Freq: Once | ORAL | Status: AC
  Administered 2019-06-20: 1 via ORAL
  Filled 2019-06-20: qty 1

## 2019-06-20 MED ORDER — TETANUS-DIPHTH-ACELL PERTUSSIS 5-2.5-18.5 LF-MCG/0.5 IM SUSP
0.5000 mL | Freq: Once | INTRAMUSCULAR | Status: AC
  Administered 2019-06-20: 0.5 mL via INTRAMUSCULAR
  Filled 2019-06-20: qty 0.5

## 2019-06-20 MED ORDER — RABIES VACCINE, PCEC IM SUSR
1.0000 mL | Freq: Once | INTRAMUSCULAR | Status: AC
  Administered 2019-06-20: 23:00:00 1 mL via INTRAMUSCULAR
  Filled 2019-06-20: qty 1

## 2019-06-20 NOTE — ED Triage Notes (Signed)
Pt in POV. Reports she was attacked by [place] just PTA. Presents with puncture wound to R wrist and R elbow. Abrasions to R side. Tetanus not UTD. VSS.

## 2019-06-20 NOTE — ED Notes (Signed)
No answer

## 2019-06-20 NOTE — ED Provider Notes (Signed)
[facility] EMERGENCY DEPARTMENT Provider Note   CSN: [id number] Arrival date & time: 06/20/19  2111     History Chief Complaint  Patient presents with  . Animal Bite    Ariel Bailey is a 49 y.o. female with a history of depression & HIV who presents to the ED with complaints of dog bite to right upper extremity which occurred around 6 PM this evening.  Patient states that a stray dog bit her right elbow, forearm, and wrist area and scratched her right side.  She reports the areas are very painful, worse with movement, no alleviating factors.  Denies numbness, tingling, or weakness. Denies fever or chills.  Patient is right-hand dominant.  Unknown last tetanus.  She does not think they will be able to find the dog and does not know its rabies vaccination status.  HPI     Past Medical History:  Diagnosis Date  . Depression   . HIV disease (HCC)     There are no problems to display for this patient.   Past Surgical History:  Procedure Laterality Date  . Pelvic Mass Removal       OB History   No obstetric history on file.     No family history on file.  Social History   Tobacco Use  . Smoking status: Never Smoker  . Smokeless tobacco: Never Used  Substance Use Topics  . Alcohol use: No  . Drug use: No    Home Medications Prior to Admission medications   Medication Sig Start Date End Date Taking? Authorizing Provider  hydrocodone-ibuprofen (VICOPROFEN) 5-200 MG tablet Take 1 tablet by mouth every 6 (six) hours as needed for pain. 08/29/16   Little, Traci M, PA-C  hydrocortisone (ANUSOL-HC) 25 MG suppository Place 1 suppository (25 mg total) rectally 2 (two) times daily as needed for hemorrhoids or anal itching. 08/29/16   Little, Traci M, PA-C  TRIUMEQ 600-50-300 MG TABS Take 1 tablet by mouth daily. 07/07/14   [provider]    Allergies    Dapsone and Penicillins  Review of Systems   Review of Systems    Constitutional: Negative for chills and fever.  Respiratory: Negative for shortness of breath.   Cardiovascular: Negative for chest pain.  Gastrointestinal: Negative for abdominal pain.  Musculoskeletal: Positive for arthralgias and myalgias.  Skin: Positive for wound.  Neurological: Negative for weakness and numbness.    Physical Exam Updated Vital Signs BP (!) 179/111 (BP Location: Left Arm)   Pulse 74   Temp 98.8 F (37.1 C) (Oral)   Resp 18   Ht 5\' 2"  (1.575 m)   Wt 83.9 kg   SpO2 93%   BMI 33.84 kg/m   Physical Exam Vitals and nursing note reviewed.  Constitutional:      General: She is not in acute distress.    Appearance: Normal appearance. She is well-developed. She is not ill-appearing or toxic-appearing.  HENT:     Head: Normocephalic and atraumatic.  Eyes:     General:        Right eye: No discharge.        Left eye: No discharge.     Conjunctiva/sclera: Conjunctivae normal.  Neck:     Comments: No midline tenderness.  Cardiovascular:     Rate and Rhythm: Normal rate and regular rhythm.     Pulses:          Radial pulses are 2+ on the right side and 2+ on the left  side.  Pulmonary:     Effort: Pulmonary effort is normal. No respiratory distress.     Breath sounds: Normal breath sounds. No wheezing, rhonchi or rales.     Comments: Mild abrasion noted to the right anterior lateral lower chest wall.  No palpable crepitus or significant tenderness to palpation. Chest:     Chest wall: No tenderness.  Abdominal:     General: There is no distension.     Palpations: Abdomen is soft.     Tenderness: There is no abdominal tenderness.  Musculoskeletal:     Cervical back: Normal range of motion and neck supple.     Comments: Upper extremities: Patient has a puncture wound noted to the proximal aspect of the right dorsal forearm just distal to the elbow as well as a puncture wound to the ventral aspect of the right wrist.  Each puncture wound is a few millimeters  in depth.  There are no visible foreign bodies.  No active bleeding.  She has multiple abrasions which appear superficial to the right forearm region.  There is some swelling noted.  She has intact active range of motion throughout with the exception of mild limitation in wrist flexion/extension as well as elbow supination/pronation, she is able to move somewhat each of these directions.  She is tender to palpation to the diffuse posterior right elbow, the forearm, as well as the ventral right wrist.  Upper extremities are otherwise nontender.  No anatomical snuffbox tenderness. Back: No midline tenderness. Lower extremities: Nontender.  Skin:    General: Skin is warm and dry.     Capillary Refill: Capillary refill takes less than 2 seconds.     Findings: No rash.  Neurological:     Mental Status: She is alert.     Comments: Alert. Clear speech. Sensation grossly intact to bilateral upper extremities. 5/5 symmetric grip strength.  Able to perform okay sign, thumbs up, and cross second/third digits bilaterally.  Ambulatory.   Psychiatric:        Mood and Affect: Mood normal.        Behavior: Behavior normal.     ED Results / Procedures / Treatments   Labs (all labs ordered are listed, but only abnormal results are displayed) Labs Reviewed - No data to display  EKG None  Radiology DG Elbow Complete Right  Result Date: 06/20/2019 CLINICAL DATA:  Dog bite EXAM: RIGHT ELBOW - COMPLETE 3+ VIEW COMPARISON:  None. FINDINGS: There is no evidence of fracture, dislocation, or joint effusion. No radiopaque foreign body. There is focal laceration with soft tissue swelling seen over the ulnar posterior aspect of the proximal forearm. IMPRESSION: No acute osseous abnormality or radiopaque foreign body. Electronically Signed   By: Prudencio Pair M.D.   On: 06/20/2019 23:12   DG Forearm Right  Result Date: 06/20/2019 CLINICAL DATA:  Dog bite EXAM: RIGHT FOREARM - 2 VIEW COMPARISON:  None. FINDINGS: No  fracture or dislocation. No radiopaque foreign body. Soft tissue swelling with focal laceration seen overlying the ulnar aspect of the proximal forearm. There is also a small soft tissue laceration seen overlying the distal radius. IMPRESSION: No osseous injury or radiopaque foreign body. Electronically Signed   By: Prudencio Pair M.D.   On: 06/20/2019 23:11   DG Wrist Complete Right  Result Date: 06/20/2019 CLINICAL DATA:  Dog bite EXAM: RIGHT WRIST - COMPLETE 3+ VIEW COMPARISON:  None. FINDINGS: There is no evidence of fracture or dislocation. No radiopaque foreign body. There is  soft tissue swelling seen predominantly around the dorsum of the wrist. IMPRESSION: No acute osseous abnormality or radiopaque foreign body. Electronically Signed   By: Jonna Clark M.D.   On: 06/20/2019 23:10    Procedures Procedures (including critical care time)  Medications Ordered in ED Medications - No data to display  ED Course  I have reviewed the triage vital signs and the nursing notes.  Pertinent labs & imaging results that were available during my care of the patient were reviewed by me and considered in my medical decision making (see chart for details).    MDM Rules/Calculators/A&P                     Patient presents to the emergency department status post dog bite injuries to the right upper extremity and mild abrasion to the right lower chest wall.  She is nontoxic, resting comfortably, her blood pressure was noted to be elevated, low suspicion for hypertensive emergency, this will need PCP recheck.  I have ordered x-rays of the right elbow, forearm, and wrist, I personally reviewed and interpreted these images, no acute fx/dislocation or radiopaque FB.  Patient is neurovascularly intact distally.  Tetanus will be updated.  Unknown rabies status of the dog therefore rabies prophylaxis was initiated. Her wounds were cleansed with betadine, irrigated, and dressed with antibiotic ointment.  There were no  visible foreign bodies.  Patient is penicillin allergic therefore we will start doxycycline and Flagyl for prophylaxis. I discussed results, treatment plan, need for follow-up (including for vaccination series), and return precautions with the patient. Provided opportunity for questions, patient confirmed understanding and is in agreement with plan.   Final Clinical Impression(s) / ED Diagnoses Final diagnoses:  Dog bite, initial encounter  Elevated blood pressure reading    Rx / DC Orders ED Discharge Orders         Ordered    naproxen (NAPROSYN) 375 MG tablet  2 times daily PRN     06/21/19 0000    doxycycline (VIBRAMYCIN) 100 MG capsule  2 times daily     06/21/19 0000    metroNIDAZOLE (FLAGYL) 500 MG tablet  2 times daily     06/21/19 0000           Ophelia Sipe, Pleas Koch, PA-C 06/21/19 0005    Charlynne Pander, MD 06/21/19 [phone]

## 2019-06-21 MED ORDER — NAPROXEN 375 MG PO TABS: 375.0000 mg | ORAL_TABLET | Freq: Two times a day (BID) | ORAL | 0 refills | Status: DC | PRN

## 2019-06-21 MED ORDER — METRONIDAZOLE 500 MG PO TABS
500.0000 mg | ORAL_TABLET | Freq: Two times a day (BID) | ORAL | 0 refills | Status: DC
Start: 2019-06-21 — End: 2019-08-27

## 2019-06-21 MED ORDER — DOXYCYCLINE HYCLATE 100 MG PO CAPS
100.0000 mg | ORAL_CAPSULE | Freq: Two times a day (BID) | ORAL | 0 refills | Status: DC
Start: 2019-06-21 — End: 2019-08-27

## 2019-06-21 NOTE — Discharge Instructions (Signed)
You were seen in the emergency department today following dog bite injuries.  Your x-rays did not show any fractures or dislocations.  You were given a tetanus vaccine as well as initiation of the rabies vaccine.  You will need additional rabies vaccines as detailed below.  Please follow the guidelines and is sure to complete the series.  We are sending you home with doxycycline and Flagyl to try to prevent infection, please take these antibiotics as prescribed.  Do not drink alcohol when taking Flagyl as it can be extremely dangerous.  We are also sending you home with naproxen to help with pain.  - Naproxen is a nonsteroidal anti-inflammatory medication that will help with pain and swelling. Be sure to take this medication as prescribed with food, 1 pill every 12 hours,  It should be taken with food, as it can cause stomach upset, and more seriously, stomach bleeding. Do not take other nonsteroidal anti-inflammatory medications with this such as Advil, Motrin, Aleve, Mobic, Goodie Powder, or Motrin.    We have prescribed you new medication(s) today. Discuss the medications prescribed today with your pharmacist as they can have adverse effects and interactions with your other medicines including over the counter and prescribed medications. Seek medical evaluation if you start to experience new or abnormal symptoms after taking one of these medicines, seek care immediately if you start to experience difficulty breathing, feeling of your throat closing, facial swelling, or rash as these could be indications of a more serious allergic reaction  Please follow-up with your primary care provider within 1 week for recheck of your wounds as well as a recheck of your blood pressure as it was elevated in the ER today. Return to the ER for new or worsening symptoms including but not limited to increased pain, redness around the wounds, drainage from the wounds, fever, chills, or any other concerns.                       RABIES VACCINE FOLLOW UP  Patient's Name: Ariel Bailey                     Original Order Date:06/20/2019  Medical Record Number: [id number]  ED Physician: No att. 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 Species of Animal: Dog   You have been seen in the Emergency Department for a possible rabies exposure. It's very important you return for the additional vaccine doses.  Please call the clinic listed below for hours of operation.   Clinic that will administer your rabies vaccines: [facility] - [address] [place], [place] [address]  [phone]  DAY 0:  06/20/2019    - received today.   DAY 3:  06/23/2019       DAY 7:  06/27/2019     DAY 14:  07/04/2019        DAY 28:  07/18/2019

## 2019-06-24 ENCOUNTER — Encounter: Payer: Self-pay | Admitting: Emergency Medicine

## 2019-06-24 ENCOUNTER — Emergency Department
Admission: EM | Admit: 2019-06-24 | Discharge: 2019-06-24 | Disposition: A | Discharge status: Home / Self Care | Payer: BC Managed Care – PPO | Attending: Emergency Medicine | Admitting: Emergency Medicine

## 2019-06-24 ENCOUNTER — Other Ambulatory Visit: Payer: Self-pay

## 2019-06-24 DIAGNOSIS — Z203 Contact with and (suspected) exposure to rabies: Secondary | ICD-10-CM | POA: Insufficient documentation

## 2019-06-24 DIAGNOSIS — Z23 Encounter for immunization: Secondary | ICD-10-CM | POA: Insufficient documentation

## 2019-06-24 DIAGNOSIS — Z21 Asymptomatic human immunodeficiency virus [HIV] infection status: Secondary | ICD-10-CM | POA: Diagnosis not present

## 2019-06-24 MED ORDER — RABIES VACCINE, PCEC IM SUSR
1.0000 mL | Freq: Once | INTRAMUSCULAR | Status: AC
  Administered 2019-06-24: 1 mL via INTRAMUSCULAR
  Filled 2019-06-24: qty 1

## 2019-06-24 NOTE — Discharge Instructions (Addendum)
Return for your scheduled rabies vaccines until completed.

## 2019-06-24 NOTE — ED Triage Notes (Signed)
Pt presents to ED via POV for f/u for rabies injection.

## 2019-06-24 NOTE — ED Provider Notes (Signed)
[facility] Emergency Department Provider Note   ____________________________________________   First MD Initiated Contact with Patient 06/24/19 1446     (approximate)  I have reviewed the triage vital signs and the nursing notes.   HISTORY  Chief Complaint Rabies Injection  HPI Ariel Bailey is a 49 y.o. female returns for her second rabies injection.  Patient states that she does have redness to her right arm where her Tdap was given.  There is no redness or pain in her thighs where the rabies vaccine was given.     Past Medical History:  Diagnosis Date  . Depression   . HIV disease (HCC)     There are no problems to display for this patient.   Past Surgical History:  Procedure Laterality Date  . Pelvic Mass Removal      Prior to Admission medications   Medication Sig Start Date End Date Taking? Authorizing Provider  doxycycline (VIBRAMYCIN) 100 MG capsule Take 1 capsule (100 mg total) by mouth 2 (two) times daily. 06/21/19   Petrucelli, Pleas Koch, PA-C  fexofenadine (ALLEGRA) 180 MG tablet Take 180 mg by mouth daily as needed for allergies or rhinitis.    [provider]  ibuprofen (ADVIL) 200 MG tablet Take 400 mg by mouth daily as needed.    [provider]  lisinopril (ZESTRIL) 10 MG tablet Take 10 mg by mouth daily. 06/13/19   [provider]  metroNIDAZOLE (FLAGYL) 500 MG tablet Take 1 tablet (500 mg total) by mouth 2 (two) times daily. 06/21/19   Petrucelli, Samantha R, PA-C  naproxen (NAPROSYN) 375 MG tablet Take 1 tablet (375 mg total) by mouth 2 (two) times daily as needed for moderate pain (swelling). 06/21/19   Petrucelli, Samantha R, PA-C  TRIUMEQ 600-50-300 MG TABS Take 1 tablet by mouth daily. 07/07/14   [provider]    Allergies Dapsone, Penicillins, and Sulfamethoxazole-trimethoprim  No family history on file.  Social History Social History   Tobacco Use  . Smoking  status: Never Smoker  . Smokeless tobacco: Never Used  Substance Use Topics  . Alcohol use: No  . Drug use: No    Review of Systems Constitutional: No fever/chills Eyes: No visual changes. ENT: No sore throat. Cardiovascular: Denies chest pain. Respiratory: Denies shortness of breath. Gastrointestinal: No abdominal pain.  No nausea, no vomiting.  No diarrhea.  No constipation. Genitourinary: Negative for dysuria. Musculoskeletal: Negative for back pain. Skin: Erythema right deltoid at the site of her Tdap. Neurological: Negative for headaches, focal weakness or numbness.   ____________________________________________   PHYSICAL EXAM:  VITAL SIGNS: ED Triage Vitals  Enc Vitals Group     BP 06/24/19 1426 131/83     Pulse Rate 06/24/19 1426 74     Resp 06/24/19 1426 20     Temp 06/24/19 1426 97.7 F (36.5 C)     Temp Source 06/24/19 1426 Oral     SpO2 06/24/19 1426 97 %     Weight 06/24/19 1424 185 lb (83.9 kg)     Height 06/24/19 1424 5\' 2"  (1.575 m)     Head Circumference --      Peak Flow --      Pain Score 06/24/19 1424 0     Pain Loc --      Pain Edu? --      Excl. in GC? --     Constitutional: Alert and oriented. Well appearing and in no acute distress. Eyes: Conjunctivae are  normal.  Head: Atraumatic. Neck: No stridor.   Cardiovascular: Normal rate, regular rhythm. Grossly normal heart sounds.  Good peripheral circulation. Respiratory: Normal respiratory effort.  No retractions. Lungs CTAB. Musculoskeletal: Patient is ambulatory without any assistance.  She is able to move upper and lower extremities without any difficulty. Neurologic:  Normal speech and language. No gross focal neurologic deficits are appreciated. No gait instability. Skin:  Skin is warm, dry and intact.  Erythematous warm area noted to the  right deltoid which is mildly tender. Psychiatric: Mood and affect are normal. Speech and behavior are  normal.  ____________________________________________   LABS (all labs ordered are listed, but only abnormal results are displayed)  Labs Reviewed - No data to display ____________________________________________   PROCEDURES  Procedure(s) performed (including Critical Care):  Procedures   ____________________________________________   INITIAL IMPRESSION / ASSESSMENT AND PLAN / ED COURSE  As part of my medical decision making, I reviewed the following data within the electronic MEDICAL RECORD NUMBER Notes from prior ED visits and [place] Controlled Substance Database  49 year old female presents to the ED for her continued rabies injection.  Patient did have some warmth and redness to the area where she got her Tdap.  She had no reaction at the sites where she received her rabies immunoglobulin.  Patient is to continue watching this area and should an abscess or infection start she is to return to the emergency department.  She is to stay with her scheduled rabies vaccine schedule.  ____________________________________________   FINAL CLINICAL IMPRESSION(S) / ED DIAGNOSES  Final diagnoses:  Encounter for administration of vaccine     ED Discharge Orders    None       Note:  This document was prepared using Dragon voice recognition software and may include unintentional dictation errors.    Johnn Hai, PA-C 06/24/19 1703    Carrie Mew, MD 06/27/19 2051

## 2019-08-24 ENCOUNTER — Encounter (HOSPITAL_COMMUNITY): Payer: Self-pay | Admitting: Emergency Medicine

## 2019-08-24 ENCOUNTER — Inpatient Hospital Stay (HOSPITAL_COMMUNITY)
Admission: EM | Admit: 2019-08-24 | Discharge: 2019-08-27 | DRG: 641 | Disposition: A | Discharge status: Home / Self Care | Payer: BC Managed Care – PPO | Attending: Internal Medicine | Admitting: Internal Medicine

## 2019-08-24 DIAGNOSIS — I10 Essential (primary) hypertension: Secondary | ICD-10-CM | POA: Diagnosis present

## 2019-08-24 DIAGNOSIS — Z888 Allergy status to other drugs, medicaments and biological substances status: Secondary | ICD-10-CM

## 2019-08-24 DIAGNOSIS — E872 Acidosis, unspecified: Secondary | ICD-10-CM

## 2019-08-24 DIAGNOSIS — E860 Dehydration: Principal | ICD-10-CM | POA: Diagnosis present

## 2019-08-24 DIAGNOSIS — K529 Noninfective gastroenteritis and colitis, unspecified: Secondary | ICD-10-CM | POA: Diagnosis present

## 2019-08-24 DIAGNOSIS — E876 Hypokalemia: Secondary | ICD-10-CM | POA: Diagnosis present

## 2019-08-24 DIAGNOSIS — N269 Renal sclerosis, unspecified: Secondary | ICD-10-CM | POA: Diagnosis present

## 2019-08-24 DIAGNOSIS — Z880 Allergy status to penicillin: Secondary | ICD-10-CM

## 2019-08-24 DIAGNOSIS — R112 Nausea with vomiting, unspecified: Secondary | ICD-10-CM

## 2019-08-24 DIAGNOSIS — Z21 Asymptomatic human immunodeficiency virus [HIV] infection status: Secondary | ICD-10-CM | POA: Diagnosis present

## 2019-08-24 DIAGNOSIS — R197 Diarrhea, unspecified: Secondary | ICD-10-CM

## 2019-08-24 DIAGNOSIS — R1110 Vomiting, unspecified: Secondary | ICD-10-CM | POA: Diagnosis present

## 2019-08-24 DIAGNOSIS — A084 Viral intestinal infection, unspecified: Secondary | ICD-10-CM | POA: Diagnosis present

## 2019-08-24 DIAGNOSIS — Z20822 Contact with and (suspected) exposure to covid-19: Secondary | ICD-10-CM | POA: Diagnosis present

## 2019-08-24 DIAGNOSIS — Z9049 Acquired absence of other specified parts of digestive tract: Secondary | ICD-10-CM

## 2019-08-24 DIAGNOSIS — Z882 Allergy status to sulfonamides status: Secondary | ICD-10-CM

## 2019-08-24 DIAGNOSIS — R9431 Abnormal electrocardiogram [ECG] [EKG]: Secondary | ICD-10-CM

## 2019-08-24 HISTORY — DX: Essential (primary) hypertension: I10

## 2019-08-24 LAB — CBC WITH DIFFERENTIAL/PLATELET
Abs Immature Granulocytes: 0.02 10*3/uL (ref 0.00–0.07)
Basophils Absolute: 0 10*3/uL (ref 0.0–0.1)
Basophils Relative: 0 %
Eosinophils Absolute: 0 10*3/uL (ref 0.0–0.5)
Eosinophils Relative: 0 %
HCT: 41.9 % (ref 36.0–46.0)
Hemoglobin: 15.1 g/dL — ABNORMAL HIGH (ref 12.0–15.0)
Immature Granulocytes: 0 %
Lymphocytes Relative: 40 %
Lymphs Abs: 2.5 10*3/uL (ref 0.7–4.0)
MCH: 32.9 pg (ref 26.0–34.0)
MCHC: 36 g/dL (ref 30.0–36.0)
MCV: 91.3 fL (ref 80.0–100.0)
Monocytes Absolute: 0.5 10*3/uL (ref 0.1–1.0)
Monocytes Relative: 8 %
Neutro Abs: 3.1 10*3/uL (ref 1.7–7.7)
Neutrophils Relative %: 52 %
Platelets: 401 10*3/uL — ABNORMAL HIGH (ref 150–400)
RBC: 4.59 MIL/uL (ref 3.87–5.11)
RDW: 13.4 % (ref 11.5–15.5)
WBC: 6.1 10*3/uL (ref 4.0–10.5)
nRBC: 0 % (ref 0.0–0.2)

## 2019-08-24 LAB — COMPREHENSIVE METABOLIC PANEL
ALT: 42 U/L (ref 0–44)
AST: 87 U/L — ABNORMAL HIGH (ref 15–41)
Albumin: 3.8 g/dL (ref 3.5–5.0)
Alkaline Phosphatase: 109 U/L (ref 38–126)
Anion gap: 16 — ABNORMAL HIGH (ref 5–15)
BUN: 29 mg/dL — ABNORMAL HIGH (ref 6–20)
CO2: 11 mmol/L — ABNORMAL LOW (ref 22–32)
Calcium: 9.6 mg/dL (ref 8.9–10.3)
Chloride: 109 mmol/L (ref 98–111)
Creatinine, Ser: 1.52 mg/dL — ABNORMAL HIGH (ref 0.44–1.00)
GFR calc Af Amer: 47 mL/min — ABNORMAL LOW (ref >60)
GFR calc non Af Amer: 40 mL/min — ABNORMAL LOW (ref >60)
Glucose, Bld: 164 mg/dL — ABNORMAL HIGH (ref 70–99)
Potassium: 2.3 mmol/L — CL (ref 3.5–5.1)
Sodium: 136 mmol/L (ref 135–145)
Total Bilirubin: 0.7 mg/dL (ref 0.3–1.2)
Total Protein: 9.4 g/dL — ABNORMAL HIGH (ref 6.5–8.1)

## 2019-08-24 LAB — LIPASE, BLOOD: Lipase: 99 U/L — ABNORMAL HIGH (ref 11–51)

## 2019-08-24 LAB — I-STAT BETA HCG BLOOD, ED (MC, WL, AP ONLY): I-stat hCG, quantitative: <5 m[IU]/mL (ref <5)

## 2019-08-24 MED ORDER — ONDANSETRON 4 MG PO TBDP: 4.0000 mg | ORAL_TABLET | Freq: Once | ORAL | Status: DC

## 2019-08-24 NOTE — ED Triage Notes (Signed)
Patient in POV. Reports emesis X1 week, states she has been unable to tolerate and food/fluids. Denies pain.

## 2019-08-24 NOTE — ED Notes (Signed)
Shawn PA aware K 2.3

## 2019-08-24 NOTE — ED Provider Notes (Signed)
Patient placed in Quick Look pathway, seen and evaluated   Chief Complaint: N/V  HPI:   N/V with eating or drinking for past week.  Denies fever, abdominal pain, urinary symptoms, chest pain.  ROS: N/V (one)  Physical Exam:   Gen: No distress  Neuro: Awake and Alert  Skin: Warm    Focused Exam:   No abdominal tenderness.   Initiation of care has begun. The patient has been counseled on the process, plan, and necessity for staying for the completion/evaluation, and the remainder of the medical screening examination   Concepcion Living 08/24/19 1845    Melene Plan, DO 08/24/19 2233

## 2019-08-25 ENCOUNTER — Encounter (HOSPITAL_COMMUNITY): Payer: Self-pay | Admitting: Internal Medicine

## 2019-08-25 ENCOUNTER — Other Ambulatory Visit: Payer: Self-pay

## 2019-08-25 DIAGNOSIS — K529 Noninfective gastroenteritis and colitis, unspecified: Secondary | ICD-10-CM | POA: Diagnosis present

## 2019-08-25 DIAGNOSIS — E876 Hypokalemia: Secondary | ICD-10-CM | POA: Diagnosis present

## 2019-08-25 DIAGNOSIS — Z888 Allergy status to other drugs, medicaments and biological substances status: Secondary | ICD-10-CM | POA: Diagnosis not present

## 2019-08-25 DIAGNOSIS — Z880 Allergy status to penicillin: Secondary | ICD-10-CM | POA: Diagnosis not present

## 2019-08-25 DIAGNOSIS — Z882 Allergy status to sulfonamides status: Secondary | ICD-10-CM | POA: Diagnosis not present

## 2019-08-25 DIAGNOSIS — I10 Essential (primary) hypertension: Secondary | ICD-10-CM | POA: Diagnosis present

## 2019-08-25 DIAGNOSIS — N269 Renal sclerosis, unspecified: Secondary | ICD-10-CM | POA: Diagnosis present

## 2019-08-25 DIAGNOSIS — E860 Dehydration: Secondary | ICD-10-CM | POA: Diagnosis present

## 2019-08-25 DIAGNOSIS — Z21 Asymptomatic human immunodeficiency virus [HIV] infection status: Secondary | ICD-10-CM | POA: Diagnosis present

## 2019-08-25 DIAGNOSIS — E872 Acidosis: Secondary | ICD-10-CM | POA: Diagnosis present

## 2019-08-25 DIAGNOSIS — R9431 Abnormal electrocardiogram [ECG] [EKG]: Secondary | ICD-10-CM | POA: Diagnosis not present

## 2019-08-25 DIAGNOSIS — A084 Viral intestinal infection, unspecified: Secondary | ICD-10-CM | POA: Diagnosis present

## 2019-08-25 DIAGNOSIS — Z20822 Contact with and (suspected) exposure to covid-19: Secondary | ICD-10-CM | POA: Diagnosis present

## 2019-08-25 DIAGNOSIS — Z9049 Acquired absence of other specified parts of digestive tract: Secondary | ICD-10-CM | POA: Diagnosis not present

## 2019-08-25 DIAGNOSIS — R1110 Vomiting, unspecified: Secondary | ICD-10-CM | POA: Diagnosis present

## 2019-08-25 LAB — URINALYSIS, ROUTINE W REFLEX MICROSCOPIC
Bilirubin Urine: NEGATIVE
Glucose, UA: NEGATIVE mg/dL
Ketones, ur: NEGATIVE mg/dL
Nitrite: NEGATIVE
Protein, ur: 30 mg/dL — AB
Specific Gravity, Urine: 1.008 (ref 1.005–1.030)
WBC, UA: >50 WBC/hpf — ABNORMAL HIGH (ref 0–5)
pH: 6 (ref 5.0–8.0)

## 2019-08-25 LAB — RENAL FUNCTION PANEL
Albumin: 3 g/dL — ABNORMAL LOW (ref 3.5–5.0)
Anion gap: 11 (ref 5–15)
BUN: 22 mg/dL — ABNORMAL HIGH (ref 6–20)
CO2: 11 mmol/L — ABNORMAL LOW (ref 22–32)
Calcium: 8.4 mg/dL — ABNORMAL LOW (ref 8.9–10.3)
Chloride: 114 mmol/L — ABNORMAL HIGH (ref 98–111)
Creatinine, Ser: 1.33 mg/dL — ABNORMAL HIGH (ref 0.44–1.00)
GFR calc Af Amer: 55 mL/min — ABNORMAL LOW (ref >60)
GFR calc non Af Amer: 47 mL/min — ABNORMAL LOW (ref >60)
Glucose, Bld: 212 mg/dL — ABNORMAL HIGH (ref 70–99)
Phosphorus: 1.8 mg/dL — ABNORMAL LOW (ref 2.5–4.6)
Potassium: 3 mmol/L — ABNORMAL LOW (ref 3.5–5.1)
Sodium: 136 mmol/L (ref 135–145)

## 2019-08-25 LAB — RAPID URINE DRUG SCREEN, HOSP PERFORMED
Amphetamines: NOT DETECTED
Barbiturates: NOT DETECTED
Benzodiazepines: NOT DETECTED
Cocaine: NOT DETECTED
Opiates: NOT DETECTED
Tetrahydrocannabinol: NOT DETECTED

## 2019-08-25 LAB — I-STAT VENOUS BLOOD GAS, ED
Acid-base deficit: 10 mmol/L — ABNORMAL HIGH (ref 0.0–2.0)
Bicarbonate: 14 mmol/L — ABNORMAL LOW (ref 20.0–28.0)
Calcium, Ion: 1.15 mmol/L (ref 1.15–1.40)
HCT: 44 % (ref 36.0–46.0)
Hemoglobin: 15 g/dL (ref 12.0–15.0)
O2 Saturation: 99 %
Potassium: 2.4 mmol/L — CL (ref 3.5–5.1)
Sodium: 140 mmol/L (ref 135–145)
TCO2: 15 mmol/L — ABNORMAL LOW (ref 22–32)
pCO2, Ven: 25.8 mmHg — ABNORMAL LOW (ref 44.0–60.0)
pH, Ven: 7.344 (ref 7.250–7.430)
pO2, Ven: 159 mmHg — ABNORMAL HIGH (ref 32.0–45.0)

## 2019-08-25 LAB — SARS CORONAVIRUS 2 BY RT PCR (HOSPITAL ORDER, PERFORMED IN ~~LOC~~ HOSPITAL LAB): SARS Coronavirus 2: NEGATIVE

## 2019-08-25 LAB — CBG MONITORING, ED: Glucose-Capillary: 146 mg/dL — ABNORMAL HIGH (ref 70–99)

## 2019-08-25 LAB — MAGNESIUM
Magnesium: 1.5 mg/dL — ABNORMAL LOW (ref 1.7–2.4)
Magnesium: 1.8 mg/dL (ref 1.7–2.4)

## 2019-08-25 LAB — TSH: TSH: 2.658 u[IU]/mL (ref 0.350–4.500)

## 2019-08-25 LAB — HEMOGLOBIN A1C
Hgb A1c MFr Bld: 7.7 % — ABNORMAL HIGH (ref 4.8–5.6)
Mean Plasma Glucose: 174.29 mg/dL

## 2019-08-25 MED ORDER — POTASSIUM CHLORIDE 10 MEQ/100ML IV SOLN
10.0000 meq | INTRAVENOUS | Status: DC
  Filled 2019-08-25: qty 100

## 2019-08-25 MED ORDER — POLYETHYLENE GLYCOL 3350 17 G PO PACK: 17.0000 g | PACK | Freq: Every day | ORAL | Status: DC | PRN

## 2019-08-25 MED ORDER — POTASSIUM CHLORIDE 10 MEQ/100ML IV SOLN
10.0000 meq | INTRAVENOUS | Status: AC
  Administered 2019-08-25 (×6): 10 meq via INTRAVENOUS
  Filled 2019-08-25 (×5): qty 100

## 2019-08-25 MED ORDER — POTASSIUM CHLORIDE CRYS ER 20 MEQ PO TBCR
40.0000 meq | EXTENDED_RELEASE_TABLET | ORAL | Status: AC
  Administered 2019-08-25 (×2): 40 meq via ORAL
  Filled 2019-08-25: qty 2

## 2019-08-25 MED ORDER — SODIUM CHLORIDE 0.9 % IV BOLUS (SEPSIS): 1000.0000 mL | Freq: Once | INTRAVENOUS | Status: DC

## 2019-08-25 MED ORDER — POTASSIUM CHLORIDE CRYS ER 20 MEQ PO TBCR
40.0000 meq | EXTENDED_RELEASE_TABLET | Freq: Once | ORAL | Status: DC
  Filled 2019-08-25: qty 2

## 2019-08-25 MED ORDER — MAGNESIUM SULFATE 2 GM/50ML IV SOLN
2.0000 g | Freq: Once | INTRAVENOUS | Status: AC
  Administered 2019-08-25: 2 g via INTRAVENOUS
  Filled 2019-08-25: qty 50

## 2019-08-25 MED ORDER — SODIUM CHLORIDE 0.9 % IV BOLUS
2000.0000 mL | Freq: Once | INTRAVENOUS | Status: AC
  Administered 2019-08-25: 2000 mL via INTRAVENOUS

## 2019-08-25 MED ORDER — ACETAMINOPHEN 325 MG PO TABS
650.0000 mg | ORAL_TABLET | Freq: Four times a day (QID) | ORAL | Status: DC | PRN
  Administered 2019-08-26: 650 mg via ORAL
  Filled 2019-08-25: qty 2

## 2019-08-25 MED ORDER — PROCHLORPERAZINE EDISYLATE 10 MG/2ML IJ SOLN
10.0000 mg | Freq: Four times a day (QID) | INTRAMUSCULAR | Status: DC | PRN
  Administered 2019-08-26: 10 mg via INTRAVENOUS
  Filled 2019-08-25: qty 2

## 2019-08-25 MED ORDER — ABACAVIR-DOLUTEGRAVIR-LAMIVUD 600-50-300 MG PO TABS
1.0000 | ORAL_TABLET | Freq: Every day | ORAL | Status: DC
  Administered 2019-08-25 – 2019-08-27 (×3): 1 via ORAL
  Filled 2019-08-25 (×3): qty 1

## 2019-08-25 MED ORDER — KCL-LACTATED RINGERS 20 MEQ/L IV SOLN
INTRAVENOUS | Status: DC
  Filled 2019-08-25: qty 1000

## 2019-08-25 MED ORDER — ACETAMINOPHEN 650 MG RE SUPP: 650.0000 mg | Freq: Four times a day (QID) | RECTAL | Status: DC | PRN

## 2019-08-25 MED ORDER — ENOXAPARIN SODIUM 40 MG/0.4ML ~~LOC~~ SOLN
40.0000 mg | SUBCUTANEOUS | Status: DC
  Administered 2019-08-25 – 2019-08-27 (×3): 40 mg via SUBCUTANEOUS
  Filled 2019-08-25 (×3): qty 0.4

## 2019-08-25 MED ORDER — POTASSIUM CHLORIDE 2 MEQ/ML IV SOLN
INTRAVENOUS | Status: AC
  Filled 2019-08-25 (×3): qty 1000

## 2019-08-25 MED ORDER — POTASSIUM CHLORIDE CRYS ER 20 MEQ PO TBCR
40.0000 meq | EXTENDED_RELEASE_TABLET | ORAL | Status: AC
  Administered 2019-08-25 (×2): 40 meq via ORAL
  Filled 2019-08-25 (×2): qty 2

## 2019-08-25 MED ORDER — LISINOPRIL 10 MG PO TABS
10.0000 mg | ORAL_TABLET | Freq: Every day | ORAL | Status: DC
  Administered 2019-08-25 – 2019-08-27 (×3): 10 mg via ORAL
  Filled 2019-08-25 (×3): qty 1

## 2019-08-25 NOTE — Progress Notes (Signed)
Patient ID: Ariel Bailey, female   DOB: January 04, 1971, 49 y.o.   MRN: [id number] Patient was admitted early this morning for nausea, vomiting and diarrhea along with hypokalemia and prolonged QT interval.  Patient seen and examined at bedside and plan of care discussed with her.  I have reviewed patient's medical records including this morning's H&P, vitals, labs and medications myself.  Repeat a.m. labs.  Continue IV fluids.  Start clear liquid diet and advance as tolerated.  Repeat a.m. EKG.  Stool testing is pending.

## 2019-08-25 NOTE — H&P (Signed)
History and Physical    Ariel Bailey GLO:[id number] DOB: [birth year] DOA: 08/24/2019  PCP: Joanie Coddington, MD  Patient coming from: Home   Chief Complaint:  Chief Complaint  Patient presents with  . Emesis     HPI:    49 year old female with past medical history of HIV(undetectable viral load with CD4 of 785 on 06/2019) and hypertension who presents to [facility] emergency department with complaints of nausea vomiting and diarrhea.  Patient explains that approximately 1 week ago she began to experience frequent bouts of diarrhea.  Patient describes the diarrhea as watery and nonbloody occurring at least 4 times daily.  Shortly after the onset of diarrhea patient also began to develop bouts of severe nausea and frequent bouts of nonbilious nonbloody vomiting.  Patient states that because of her symptoms she has not had any appetite and has been unable to tolerate any solid food intake but is able to occasionally hydrate herself with liquids.  Patient explains that approximately 5 days prior to presentation she had a fever of 102 F.  Patient also complains of associated generalized abdominal pain, cramping quality, moderate to severe in intensity, occurring just prior to vomiting and relieved after experiencing an episode of vomiting.  Patient denies any sick contacts, recent travel or confirmed contact with known COVID-19 infection.  Upon further questioning patient does state that she took a course of antibiotics in early July after having a dental extraction.  As symptoms persisted over the next several days the patient began to develop associated generalized weakness and lightheadedness.  The patient eventually presented to [facility] emergency department for evaluation.  Upon evaluation in the emergency department patient was found to be clinically dehydrated with evidence of severe hypokalemia, hypomagnesemia and a severely prolonged QT interval.   The hospitalist group was then called to assess the patient for admission to the hospital.    Review of Systems: A 10-system review of systems has been performed and all systems are negative with the exception of what is listed in the HPI.   Past Medical History:  Diagnosis Date  . Depression   . Essential hypertension   . Focal segmental glomerulosclerosis 2013  . HIV disease [facility])     Past Surgical History:  Procedure Laterality Date  . Pelvic Mass Removal       reports that she has never smoked. She has never used smokeless tobacco. She reports that she does not drink alcohol and does not use drugs.  Allergies  Allergen Reactions  . Dapsone Hives and Swelling  . Penicillins Hives  . Sulfamethoxazole-Trimethoprim Swelling    History reviewed. No pertinent family history.   Prior to Admission medications   Medication Sig Start Date End Date Taking? Authorizing Provider  ibuprofen (ADVIL) 200 MG tablet Take 400 mg by mouth daily as needed.   Yes [provider]  lisinopril (ZESTRIL) 10 MG tablet Take 10 mg by mouth daily. 06/13/19  Yes [provider]  TRIUMEQ 600-50-300 MG TABS Take 1 tablet by mouth daily. 07/07/14  Yes [provider]  doxycycline (VIBRAMYCIN) 100 MG capsule Take 1 capsule (100 mg total) by mouth 2 (two) times daily. Patient not taking: Reported on 08/25/2019 06/21/19   Petrucelli, Pleas Koch, PA-C  metroNIDAZOLE (FLAGYL) 500 MG tablet Take 1 tablet (500 mg total) by mouth 2 (two) times daily. Patient not taking: Reported on 08/25/2019 06/21/19   Petrucelli, Lelon Mast R, PA-C  naproxen (NAPROSYN) 375 MG tablet Take 1 tablet (  375 mg total) by mouth 2 (two) times daily as needed for moderate pain (swelling). Patient not taking: Reported on 08/25/2019 06/21/19   Cherly Anderson, PA-C    Physical Exam: Vitals:   08/24/19 1800 08/24/19 1825 08/24/19 2208 08/25/19 0121  BP: 131/84  (!) 150/94 (!) 124/95  Pulse: 93  74 89  Resp: 20   18 20   Temp: 97.7 F (36.5 C)  97.7 F (36.5 C) 98.5 F (36.9 C)  TempSrc: Oral  Oral Oral  SpO2: 96%  100% 99%  Weight:  83.9 kg    Height:  5\' 2"  (1.575 m)      Constitutional: Acute alert and oriented x3, no associated distress.   Skin: no rashes, no lesions, poor skin turgor noted. Eyes: Pupils are equally reactive to light.  No evidence of scleral icterus or conjunctival pallor.  ENMT: Dry mucous membranes noted.  Posterior pharynx clear of any exudate or lesions.   Neck: normal, supple, no masses, no thyromegaly.  No evidence of jugular venous distension.   Respiratory: clear to auscultation bilaterally, no wheezing, no crackles. Normal respiratory effort. No accessory muscle use.  Cardiovascular: Regular rate and rhythm, no murmurs / rubs / gallops. No extremity edema. 2+ pedal pulses. No carotid bruits.  Chest:   Nontender without crepitus or deformity.   Back:   Nontender without crepitus or deformity. Abdomen: Notable generalized abdominal tenderness.  Abdomen is soft.  No evidence of intra-abdominal masses.  Positive bowel sounds noted in all quadrants.   Musculoskeletal: No joint deformity upper and lower extremities. Good ROM, no contractures. Normal muscle tone.  Neurologic: CN 2-12 grossly intact. Sensation intact, strength noted to be 5 out of 5 in all 4 extremities.  Patient is following all commands.  Patient is responsive to verbal stimuli.   Psychiatric: Patient presents as a normal mood with flat affect.  Patient seems to possess insight as to theircurrent situation.     Labs on Admission: I have personally reviewed following labs and imaging studies -   CBC: Recent Labs  Lab 08/24/19 1837  WBC 6.1  NEUTROABS 3.1  HGB 15.1*  HCT 41.9  MCV 91.3  PLT 401*   Basic Metabolic Panel: Recent Labs  Lab 08/24/19 1837  NA 136  K 2.3*  CL 109  CO2 11*  GLUCOSE 164*  BUN 29*  CREATININE 1.52*  CALCIUM 9.6  MG 1.5*   GFR: Estimated Creatinine Clearance:  45.9 mL/min (A) (by C-G formula based on SCr of 1.52 mg/dL (H)). Liver Function Tests: Recent Labs  Lab 08/24/19 1837  AST 87*  ALT 42  ALKPHOS 109  BILITOT 0.7  PROT 9.4*  ALBUMIN 3.8   Recent Labs  Lab 08/24/19 1837  LIPASE 99*   No results for input(s): AMMONIA in the last 168 hours. Coagulation Profile: No results for input(s): INR, PROTIME in the last 168 hours. Cardiac Enzymes: No results for input(s): CKTOTAL, CKMB, CKMBINDEX, TROPONINI in the last 168 hours. BNP (last 3 results) No results for input(s): PROBNP in the last 8760 hours. HbA1C: Recent Labs    08/25/19 0344  HGBA1C 7.7*   CBG: Recent Labs  Lab 08/25/19 0305  GLUCAP 146*   Lipid Profile: No results for input(s): CHOL, HDL, LDLCALC, TRIG, CHOLHDL, LDLDIRECT in the last 72 hours. Thyroid Function Tests: No results for input(s): TSH, T4TOTAL, FREET4, T3FREE, THYROIDAB in the last 72 hours. Anemia Panel: No results for input(s): VITAMINB12, FOLATE, FERRITIN, TIBC, IRON, RETICCTPCT in the last 72  hours. Urine analysis: No results found for: COLORURINE, APPEARANCEUR, LABSPEC, PHURINE, GLUCOSEU, HGBUR, BILIRUBINUR, KETONESUR, PROTEINUR, UROBILINOGEN, NITRITE, LEUKOCYTESUR  Radiological Exams on Admission - Personally Reviewed: No results found.  EKG: Personally reviewed.  Rhythm is normal sinus rhythm with heart rate of 77 bpm.  Prolonged QT interval of 630 ms.  No dynamic ST segment changes appreciated.  Assessment/Plan Principal Problem:   Acute gastroenteritis   Patient presenting with 1 week history of nausea vomiting and diarrhea  Patient's reports of having recorded fever several days ago suggest that this is infectious in etiology  Considering patient's excellent CD4 count and viral load at this point, a viral gastroenteritis is most likely.  If patient does not rapidly clinically improve will obtain CT imaging of the abdomen pelvis to evaluate further  Considering patient's recent  antibiotic use if diarrhea persists will obtain stool studies for C. difficile testing.  Providing patient with as needed antiemetics  Aggressive intravenous volume resuscitation  Aggressive correction of electrolyte abnormalities.  Active Problems:   Prolonged QT interval   Significant prolonged QT interval based on admission EKG  Monitoring patient on telemetry  Attempted to correct patient's severe hypokalemia and hypomagnesia which are likely contributing to the prolonged QT interval.    Hypokalemia due to excessive gastrointestinal loss of potassium   Aggressive replacement with intravenous and oral modalities.  Monitoring potassium levels with serial chemistries.    Hypomagnesemia    Replacing with intravenous magnesium sulfate.   Code Status:  Full code Family Communication: deferred   Status is: Observation  The patient remains OBS appropriate and will d/c before 2 midnights.  Dispo: The patient is from: Home              Anticipated d/c is to: Home              Anticipated d/c date is: 2 days              Patient currently is not medically stable to d/c.        Marinda Elk MD [facility] Pager [phone]  If 7PM-7AM, please contact night-coverage [email or web address] Use universal [place] password for that web site. If you do not have the password, please call the hospital operator.  08/25/2019, 4:20 AM

## 2019-08-25 NOTE — ED Notes (Signed)
IV access attempted x3, IV team consult placed

## 2019-08-25 NOTE — ED Notes (Signed)
Lunch Tray Ordered @ 1120. 

## 2019-08-25 NOTE — ED Provider Notes (Signed)
TIME SEEN: 2:06 AM  CHIEF COMPLAINT: Nausea and vomiting  HPI: Patient is a 49 year old female with history of HIV followed at [facility] (last CD4 914 in January 2021 and nondetectable viral load in June 2021) who presents to the emergency department with complaints of nausea vomiting and diarrhea for the past week.  Patient reports having a fever of 101 earlier this week.  Did have some upper abdominal cramping mostly with vomiting but no other abdominal pain.  She denies dysuria, hematuria, vaginal bleeding or discharge.  She has had a previous cholecystectomy and a cystic mass moved from her abdomen.  No history of diabetes, gastroparesis, marijuana use.  No sick contacts or recent travel.  No chest pain or shortness of breath.  No history of alcohol abuse.  States she only drinks alcohol occasionally.  ROS: See HPI Constitutional:  fever  Eyes: no drainage  ENT: no runny nose   Cardiovascular:  no chest pain  Resp: no SOB  GI: vomiting, diarrhea GU: no dysuria Integumentary: no rash  Allergy: no hives  Musculoskeletal: no leg swelling  Neurological: no slurred speech ROS otherwise negative  PAST MEDICAL HISTORY/PAST SURGICAL HISTORY:  Past Medical History:  Diagnosis Date  . Depression   . HIV disease (HCC)     MEDICATIONS:  Prior to Admission medications   Medication Sig Start Date End Date Taking? Authorizing Provider  doxycycline (VIBRAMYCIN) 100 MG capsule Take 1 capsule (100 mg total) by mouth 2 (two) times daily. 06/21/19   Petrucelli, Pleas Koch, PA-C  fexofenadine (ALLEGRA) 180 MG tablet Take 180 mg by mouth daily as needed for allergies or rhinitis.    [provider]  ibuprofen (ADVIL) 200 MG tablet Take 400 mg by mouth daily as needed.    [provider]  lisinopril (ZESTRIL) 10 MG tablet Take 10 mg by mouth daily. 06/13/19   [provider]  metroNIDAZOLE (FLAGYL) 500 MG tablet Take 1 tablet (500 mg total) by mouth 2 (two) times daily. 06/21/19    Petrucelli, Samantha R, PA-C  naproxen (NAPROSYN) 375 MG tablet Take 1 tablet (375 mg total) by mouth 2 (two) times daily as needed for moderate pain (swelling). 06/21/19   Petrucelli, Samantha R, PA-C  TRIUMEQ 600-50-300 MG TABS Take 1 tablet by mouth daily. 07/07/14   [provider]    ALLERGIES:  Allergies  Allergen Reactions  . Dapsone Hives and Swelling  . Penicillins Hives  . Sulfamethoxazole-Trimethoprim Swelling    SOCIAL HISTORY:  Social History   Tobacco Use  . Smoking status: Never Smoker  . Smokeless tobacco: Never Used  Substance Use Topics  . Alcohol use: No    FAMILY HISTORY: No family history on file.  EXAM: BP (!) 124/95 (BP Location: Left Arm)   Pulse 89   Temp 98.5 F (36.9 C) (Oral)   Resp 20   Ht 5\' 2"  (1.575 m)   Wt 83.9 kg   SpO2 99%   BMI 33.84 kg/m  CONSTITUTIONAL: Alert and oriented and responds appropriately to questions. Well-appearing; well-nourished HEAD: Normocephalic EYES: Conjunctivae clear, pupils appear equal, EOM appear intact ENT: normal nose; slightly dry appearing mucous membranes NECK: Supple, normal ROM CARD: RRR; S1 and S2 appreciated; no murmurs, no clicks, no rubs, no gallops RESP: Normal chest excursion without splinting or tachypnea; breath sounds clear and equal bilaterally; no wheezes, no rhonchi, no rales, no hypoxia or respiratory distress, speaking full sentences ABD/GI: Normal bowel sounds; non-distended; soft, non-tender, no rebound, no guarding, no peritoneal  signs, no hepatosplenomegaly BACK:  The back appears normal EXT: Normal ROM in all joints; no deformity noted, no edema; no cyanosis SKIN: Normal color for age and race; warm; no rash on exposed skin NEURO: Moves all extremities equally PSYCH: The patient's mood and manner are appropriate.   MEDICAL DECISION MAKING: Patient here with nausea, vomiting and diarrhea.  Initially had fever at the beginning of this week that has resolved.  Her abdominal  exam today is benign.  Symptoms may be due to gastroenteritis.  She has no history of gastroparesis, cyclic vomiting, marijuana abuse, diabetes.  Blood sugar here was in the 150s.  Her labs do show potassium of 2.3 and she does have a prolonged QT interval 630 ms on her EKG with no old for comparison.  She will need IV and oral potassium replacement.  We will also check magnesium level.  Will obtain VBG.  Will give IV fluids.  Will hold on antiemetics as she states she is feeling okay at this time and is not actively vomiting given her prolonged QT interval.  I feel she will need admission to the hospital.  She is comfortable with this plan.  Given benign abdominal exam, I do not feel she needs imaging currently.  Doubt bowel obstruction, perforation, appendicitis, colitis, diverticulitis.  Lipase minimally elevated which may be due to profuse vomiting.  She is not tender in the upper abdomen to suggest pancreatitis at this time.  ED PROGRESS: CBG now 146.  Doubt DKA.    3:25 AM Discussed patient's case with hospitalist, Dr. Leafy Half.  I have recommended admission and patient (and family if present) agree with this plan. Admitting physician will place admission orders.   I reviewed all nursing notes, vitals, pertinent previous records and reviewed/interpreted all EKGs, lab and urine results, imaging (as available).      EKG Interpretation  Date/Time:  Thursday August 25 2019 02:20:25 EDT Ventricular Rate:  77 PR Interval:    QRS Duration: 73 QT Interval:  556 QTC Calculation: 630 R Axis:   103 Text Interpretation: Sinus rhythm Right axis deviation Low voltage, precordial leads Borderline T abnormalities, diffuse leads Prolonged QT interval No old tracing to compare Confirmed by Christinna Sprung, Baxter Hire [phone]) on 08/25/2019 2:31:45 AM        CRITICAL CARE Performed by: Baxter Hire Charron Coultas   Total critical care time: 55 minutes  Critical care time was exclusive of separately billable procedures and  treating other patients.  Critical care was necessary to treat or prevent imminent or life-threatening deterioration.  Critical care was time spent personally by me on the following activities: development of treatment plan with patient and/or surrogate as well as nursing, discussions with consultants, evaluation of patient's response to treatment, examination of patient, obtaining history from patient or surrogate, ordering and performing treatments and interventions, ordering and review of laboratory studies, ordering and review of radiographic studies, pulse oximetry and re-evaluation of patient's condition.   Mateya Renee Moose was evaluated in Emergency Department on 08/25/2019 for the symptoms described in the history of present illness. She was evaluated in the context of the global COVID-19 pandemic, which necessitated consideration that the patient might be at risk for infection with the SARS-CoV-2 virus that causes COVID-19. Institutional protocols and algorithms that pertain to the evaluation of patients at risk for COVID-19 are in a state of rapid change based on information released by regulatory bodies including the CDC and federal and state organizations. These policies and algorithms were followed during the patient's  care in the ED.      Airianna Kreischer, Layla Maw, DO 08/25/19 [phone]

## 2019-08-25 NOTE — ED Notes (Signed)
Pharmacy called about missing dose of LR w/ KCl after message sent. Pharmacy to send

## 2019-08-25 NOTE — ED Notes (Signed)
IV team at bedside 

## 2019-08-26 DIAGNOSIS — E872 Acidosis: Secondary | ICD-10-CM

## 2019-08-26 DIAGNOSIS — K529 Noninfective gastroenteritis and colitis, unspecified: Secondary | ICD-10-CM | POA: Diagnosis not present

## 2019-08-26 DIAGNOSIS — E876 Hypokalemia: Secondary | ICD-10-CM

## 2019-08-26 DIAGNOSIS — R9431 Abnormal electrocardiogram [ECG] [EKG]: Secondary | ICD-10-CM

## 2019-08-26 LAB — COMPREHENSIVE METABOLIC PANEL
ALT: 34 U/L (ref 0–44)
AST: 62 U/L — ABNORMAL HIGH (ref 15–41)
Albumin: 3.1 g/dL — ABNORMAL LOW (ref 3.5–5.0)
Alkaline Phosphatase: 77 U/L (ref 38–126)
Anion gap: 11 (ref 5–15)
BUN: 17 mg/dL (ref 6–20)
CO2: 11 mmol/L — ABNORMAL LOW (ref 22–32)
Calcium: 8.8 mg/dL — ABNORMAL LOW (ref 8.9–10.3)
Chloride: 117 mmol/L — ABNORMAL HIGH (ref 98–111)
Creatinine, Ser: 1.33 mg/dL — ABNORMAL HIGH (ref 0.44–1.00)
GFR calc Af Amer: 55 mL/min — ABNORMAL LOW (ref >60)
GFR calc non Af Amer: 47 mL/min — ABNORMAL LOW (ref >60)
Glucose, Bld: 106 mg/dL — ABNORMAL HIGH (ref 70–99)
Potassium: 3.5 mmol/L (ref 3.5–5.1)
Sodium: 139 mmol/L (ref 135–145)
Total Bilirubin: 0.6 mg/dL (ref 0.3–1.2)
Total Protein: 7 g/dL (ref 6.5–8.1)

## 2019-08-26 LAB — CBC WITH DIFFERENTIAL/PLATELET
Abs Immature Granulocytes: 0.02 10*3/uL (ref 0.00–0.07)
Basophils Absolute: 0 10*3/uL (ref 0.0–0.1)
Basophils Relative: 0 %
Eosinophils Absolute: 0 10*3/uL (ref 0.0–0.5)
Eosinophils Relative: 1 %
HCT: 33.7 % — ABNORMAL LOW (ref 36.0–46.0)
Hemoglobin: 11.9 g/dL — ABNORMAL LOW (ref 12.0–15.0)
Immature Granulocytes: 0 %
Lymphocytes Relative: 52 %
Lymphs Abs: 3 10*3/uL (ref 0.7–4.0)
MCH: 33.8 pg (ref 26.0–34.0)
MCHC: 35.3 g/dL (ref 30.0–36.0)
MCV: 95.7 fL (ref 80.0–100.0)
Monocytes Absolute: 0.4 10*3/uL (ref 0.1–1.0)
Monocytes Relative: 7 %
Neutro Abs: 2.3 10*3/uL (ref 1.7–7.7)
Neutrophils Relative %: 40 %
Platelets: 329 10*3/uL (ref 150–400)
RBC: 3.52 MIL/uL — ABNORMAL LOW (ref 3.87–5.11)
RDW: 14.3 % (ref 11.5–15.5)
WBC: 5.7 10*3/uL (ref 4.0–10.5)
nRBC: 0 % (ref 0.0–0.2)

## 2019-08-26 LAB — MAGNESIUM: Magnesium: 1.7 mg/dL (ref 1.7–2.4)

## 2019-08-26 MED ORDER — SODIUM BICARBONATE 8.4 % IV SOLN
INTRAVENOUS | Status: DC
  Filled 2019-08-26 (×4): qty 150

## 2019-08-26 MED ORDER — SODIUM BICARBONATE 8.4 % IV SOLN
INTRAVENOUS | Status: DC
  Filled 2019-08-26: qty 75

## 2019-08-26 MED ORDER — SODIUM BICARBONATE-DEXTROSE 150-5 MEQ/L-% IV SOLN: 150.0000 meq | INTRAVENOUS | Status: DC

## 2019-08-26 MED ORDER — POTASSIUM CHLORIDE CRYS ER 20 MEQ PO TBCR
40.0000 meq | EXTENDED_RELEASE_TABLET | Freq: Once | ORAL | Status: AC
  Administered 2019-08-26: 40 meq via ORAL
  Filled 2019-08-26: qty 2

## 2019-08-26 MED ORDER — K PHOS MONO-SOD PHOS DI & MONO 155-852-130 MG PO TABS
250.0000 mg | ORAL_TABLET | Freq: Two times a day (BID) | ORAL | Status: AC
  Administered 2019-08-26 (×2): 250 mg via ORAL
  Filled 2019-08-26 (×2): qty 1

## 2019-08-26 NOTE — Plan of Care (Signed)
  Problem: Education: Goal: Knowledge of General Education information will improve Description Including pain rating scale, medication(s)/side effects and non-pharmacologic comfort measures Outcome: Progressing   Problem: Clinical Measurements: Goal: Ability to maintain clinical measurements within normal limits will improve Outcome: Progressing   Problem: Activity: Goal: Risk for activity intolerance will decrease Outcome: Progressing   

## 2019-08-26 NOTE — Progress Notes (Signed)
Patient ID: Ariel Bailey, female   DOB: 1970-10-04, 49 y.o.   MRN: [id number]  PROGRESS NOTE    Keoshia Steinmetz Cobaugh  ASN:[id number] DOB: May 15, 1970 DOA: 08/24/2019 PCP: Joanie Coddington, MD   Brief Narrative:  49 year old female with history of HIV (undetectable viral load with CD4 of 785 in 5/201) and hypertension presented with nausea, vomiting and diarrhea worsening over the last 1 week.  On presentation, she was severely hypokalemic and hypomagnesemic with prolonged QT.  She was started on IV fluids.  Assessment & Plan:   Probable acute gastroenteritis -Presented with worsening nausea, vomiting and diarrhea for a week -Improving.  Tolerating liquid diet.  Will advance diet to soft diet today.  Diarrhea is also improving.  Stool studies are pending.  Will hold off on CT of the abdomen for now.  Acute metabolic acidosis -Probably from diarrhea.  Bicarb level 11 this morning.  Will start patient on bicarb drip.  Monitor  Prolonged QT interval -Probably from electrolyte imbalances.  Repeat EKG in a.m.  Hypokalemia -Much improved -Continue replacement.  Repeat a.m. labs  Hypomagnesemia -Improved  HIV -undetectable viral load with CD4 of 785 in 5/201 -Continue current antiretroviral regimen.  Outpatient follow-up with ID.  Hypertension -Blood pressure stable.  Monitor.  Continue lisinopril   DVT prophylaxis: Lovenox Code Status: Full Family Communication: None at bedside Disposition Plan: Status is: Inpatient  Remains inpatient appropriate because:Inpatient level of care appropriate due to severity of illness   Dispo: The patient is from: Home              Anticipated d/c is to: Home              Anticipated d/c date is: 1 day              Patient currently is not medically stable to d/c.   Consultants: None  Procedures: None  Antimicrobials: Antiretroviral continued from outpatient regimen   Subjective: Patient seen and  examined at bedside.  Denies worsening abdominal pain.  No overnight fever or vomiting.  Diarrhea is improving, had 1 loose bowel movement this morning.  Currently not nauseous  Objective: Vitals:   08/25/19 1749 08/25/19 2156 08/26/19 0149 08/26/19 0557  BP: 106/72 111/83 101/67 101/67  Pulse: 66 67 63 63  Resp:  16 16 16   Temp:  98.2 F (36.8 C) (!) 97.5 F (36.4 C) (!) 97.5 F (36.4 C)  TempSrc:  Oral Oral Oral  SpO2: 100% 100% 98% 98%  Weight:      Height:        Intake/Output Summary (Last 24 hours) at 08/26/2019 08/28/2019 Last data filed at 08/25/2019 2300 Gross per 24 hour  Intake 2451.31 ml  Output --  Net 2451.31 ml   Filed Weights   08/24/19 1825  Weight: 83.9 kg    Examination:  General exam: Appears calm and comfortable  Respiratory system: Bilateral decreased breath sounds at bases Cardiovascular system: S1 & S2 heard, Rate controlled Gastrointestinal system: Abdomen is nondistended, soft and nontender. Normal bowel sounds heard. Extremities: No cyanosis, clubbing, edema  Central nervous system: Alert and oriented. No focal neurological deficits. Moving extremities Skin: No rashes, lesions or ulcers Psychiatry: Flat affect.    Data Reviewed: I have personally reviewed following labs and imaging studies  CBC: Recent Labs  Lab 08/24/19 1837 08/25/19 0544 08/26/19 0128  WBC 6.1  --  5.7  NEUTROABS 3.1  --  2.3  HGB 15.1* 15.0 11.9*  HCT 41.9 44.0  33.7*  MCV 91.3  --  95.7  PLT 401*  --  329   Basic Metabolic Panel: Recent Labs  Lab 08/24/19 1837 08/25/19 0544 08/25/19 1435 08/26/19 0128  NA 136 140 136 139  K 2.3* 2.4* 3.0* 3.5  CL 109  --  114* 117*  CO2 11*  --  11* 11*  GLUCOSE 164*  --  212* 106*  BUN 29*  --  22* 17  CREATININE 1.52*  --  1.33* 1.33*  CALCIUM 9.6  --  8.4* 8.8*  MG 1.5*  --  1.8 1.7  PHOS  --   --  1.8*  --    GFR: Estimated Creatinine Clearance: 52.5 mL/min (A) (by C-G formula based on SCr of 1.33 mg/dL  (H)). Liver Function Tests: Recent Labs  Lab 08/24/19 1837 08/25/19 1435 08/26/19 0128  AST 87*  --  62*  ALT 42  --  34  ALKPHOS 109  --  77  BILITOT 0.7  --  0.6  PROT 9.4*  --  7.0  ALBUMIN 3.8 3.0* 3.1*   Recent Labs  Lab 08/24/19 1837  LIPASE 99*   No results for input(s): AMMONIA in the last 168 hours. Coagulation Profile: No results for input(s): INR, PROTIME in the last 168 hours. Cardiac Enzymes: No results for input(s): CKTOTAL, CKMB, CKMBINDEX, TROPONINI in the last 168 hours. BNP (last 3 results) No results for input(s): PROBNP in the last 8760 hours. HbA1C: Recent Labs    08/25/19 0344  HGBA1C 7.7*   CBG: Recent Labs  Lab 08/25/19 0305  GLUCAP 146*   Lipid Profile: No results for input(s): CHOL, HDL, LDLCALC, TRIG, CHOLHDL, LDLDIRECT in the last 72 hours. Thyroid Function Tests: Recent Labs    08/25/19 1435  TSH 2.658   Anemia Panel: No results for input(s): VITAMINB12, FOLATE, FERRITIN, TIBC, IRON, RETICCTPCT in the last 72 hours. Sepsis Labs: No results for input(s): PROCALCITON, LATICACIDVEN in the last 168 hours.  Recent Results (from the past 240 hour(s))  SARS Coronavirus 2 by RT PCR (hospital order, performed in [place] hospital lab) Nasopharyngeal Nasopharyngeal Swab     Status: None   Collection Time: 08/25/19  2:07 AM   Specimen: Nasopharyngeal Swab  Result Value Ref Range Status   SARS Coronavirus 2 NEGATIVE NEGATIVE Final    Comment: (NOTE) SARS-CoV-2 target nucleic acids are NOT DETECTED.  The SARS-CoV-2 RNA is generally detectable in upper and lower respiratory specimens during the acute phase of infection. The lowest concentration of SARS-CoV-2 viral copies this assay can detect is 250 copies / mL. A negative result does not preclude SARS-CoV-2 infection and should not be used as the sole basis for treatment or other patient management decisions.  A negative result may occur with improper specimen collection / handling,  submission of specimen other than nasopharyngeal swab, presence of viral mutation(s) within the areas targeted by this assay, and inadequate number of viral copies (<250 copies / mL). A negative result must be combined with clinical observations, patient history, and epidemiological information.  Fact Sheet for Patients:   BoilerBrush.com.cy  Fact Sheet for Healthcare Providers: https://pope.com/  This test is not yet approved or  cleared by the [place] FDA and has been authorized for detection and/or diagnosis of SARS-CoV-2 by FDA under an Emergency Use Authorization (EUA).  This EUA will remain in effect (meaning this test can be used) for the duration of the COVID-19 declaration under Section 564(b)(1) of the Act, 21 U.S.C. section 360bbb-3(b)(1),  unless the authorization is terminated or revoked sooner.  Performed at [facility], [address], [place], [place] [address]          Radiology Studies: No results found.      Scheduled Meds: . abacavir-dolutegravir-lamiVUDine  1 tablet Oral Daily  . enoxaparin (LOVENOX) injection  40 mg Subcutaneous Q24H  . lisinopril  10 mg Oral Daily   Continuous Infusions: .  sodium bicarbonate  infusion 1000 mL 100 mL/hr at 08/26/19 0849          Glade Lloyd, MD [facility] 08/26/2019, 9:22 AM

## 2019-08-27 ENCOUNTER — Encounter (HOSPITAL_COMMUNITY): Payer: Self-pay | Admitting: *Deleted

## 2019-08-27 DIAGNOSIS — K529 Noninfective gastroenteritis and colitis, unspecified: Secondary | ICD-10-CM | POA: Diagnosis not present

## 2019-08-27 DIAGNOSIS — E876 Hypokalemia: Secondary | ICD-10-CM | POA: Diagnosis not present

## 2019-08-27 DIAGNOSIS — R9431 Abnormal electrocardiogram [ECG] [EKG]: Secondary | ICD-10-CM | POA: Diagnosis not present

## 2019-08-27 LAB — CBC WITH DIFFERENTIAL/PLATELET
Abs Immature Granulocytes: 0.03 10*3/uL (ref 0.00–0.07)
Basophils Absolute: 0 10*3/uL (ref 0.0–0.1)
Basophils Relative: 0 %
Eosinophils Absolute: 0 10*3/uL (ref 0.0–0.5)
Eosinophils Relative: 1 %
HCT: 33.2 % — ABNORMAL LOW (ref 36.0–46.0)
Hemoglobin: 11.8 g/dL — ABNORMAL LOW (ref 12.0–15.0)
Immature Granulocytes: 1 %
Lymphocytes Relative: 61 %
Lymphs Abs: 2.9 10*3/uL (ref 0.7–4.0)
MCH: 33.6 pg (ref 26.0–34.0)
MCHC: 35.5 g/dL (ref 30.0–36.0)
MCV: 94.6 fL (ref 80.0–100.0)
Monocytes Absolute: 0.3 10*3/uL (ref 0.1–1.0)
Monocytes Relative: 7 %
Neutro Abs: 1.4 10*3/uL — ABNORMAL LOW (ref 1.7–7.7)
Neutrophils Relative %: 30 %
Platelets: 269 10*3/uL (ref 150–400)
RBC: 3.51 MIL/uL — ABNORMAL LOW (ref 3.87–5.11)
RDW: 14.3 % (ref 11.5–15.5)
WBC: 4.8 10*3/uL (ref 4.0–10.5)
nRBC: 0 % (ref 0.0–0.2)

## 2019-08-27 LAB — GASTROINTESTINAL PANEL BY PCR, STOOL (REPLACES STOOL CULTURE)

## 2019-08-27 LAB — MAGNESIUM: Magnesium: 1.2 mg/dL — ABNORMAL LOW (ref 1.7–2.4)

## 2019-08-27 LAB — BASIC METABOLIC PANEL
Anion gap: 11 (ref 5–15)
BUN: 12 mg/dL (ref 6–20)
CO2: 18 mmol/L — ABNORMAL LOW (ref 22–32)
Calcium: 8.4 mg/dL — ABNORMAL LOW (ref 8.9–10.3)
Chloride: 111 mmol/L (ref 98–111)
Creatinine, Ser: 1.26 mg/dL — ABNORMAL HIGH (ref 0.44–1.00)
GFR calc Af Amer: 59 mL/min — ABNORMAL LOW (ref >60)
GFR calc non Af Amer: 51 mL/min — ABNORMAL LOW (ref >60)
Glucose, Bld: 124 mg/dL — ABNORMAL HIGH (ref 70–99)
Potassium: 2.6 mmol/L — CL (ref 3.5–5.1)
Sodium: 140 mmol/L (ref 135–145)

## 2019-08-27 MED ORDER — SODIUM BICARBONATE-DEXTROSE 150-5 MEQ/L-% IV SOLN: 150.0000 meq | INTRAVENOUS | Status: DC

## 2019-08-27 MED ORDER — SODIUM BICARBONATE 8.4 % IV SOLN
INTRAVENOUS | Status: DC
  Filled 2019-08-27: qty 150

## 2019-08-27 MED ORDER — ONDANSETRON HCL 4 MG PO TABS: 4.0000 mg | ORAL_TABLET | Freq: Three times a day (TID) | ORAL | 0 refills | Status: AC | PRN

## 2019-08-27 MED ORDER — MAGNESIUM SULFATE 2 GM/50ML IV SOLN
2.0000 g | Freq: Once | INTRAVENOUS | Status: AC
  Administered 2019-08-27: 2 g via INTRAVENOUS
  Filled 2019-08-27: qty 50

## 2019-08-27 MED ORDER — POTASSIUM CHLORIDE CRYS ER 20 MEQ PO TBCR
40.0000 meq | EXTENDED_RELEASE_TABLET | Freq: Three times a day (TID) | ORAL | Status: AC
  Administered 2019-08-27 (×2): 40 meq via ORAL
  Filled 2019-08-27 (×2): qty 2

## 2019-08-27 NOTE — Progress Notes (Signed)
I have received call from lab Ariel Bailey she has informed me of Critical lab value for Ariel Bailey.  She reports that Potassium level of 2.6.  I have made provider Blount aware.  New orders given. Pt. resting in bed. She reports no discomfort.

## 2019-08-27 NOTE — Discharge Summary (Signed)
Physician Discharge Summary  Ariel Bailey CZY:[id number] DOB: 08-19-1970 DOA: 08/24/2019  PCP: Joanie CoddingtonHurt, Ariel B, MD  Admit date: 08/24/2019 Discharge date: 08/27/2019  Admitted From: Home Disposition: Home  Recommendations for Outpatient Follow-up:  1. Follow up with PCP in 1 week with repeat CBC/BMP 2. Follow up in ED if symptoms worsen or new appear   Home Health: No Equipment/Devices: None  Discharge Condition: Stable CODE STATUS: Full Diet recommendation: Heart healthy  Brief/Interim Summary: 49 year old female with history of HIV (undetectable viral load with CD4 of 785 in 5/201) and hypertension presented with nausea, vomiting and diarrhea worsening over the last 1 week.  On presentation, she was severely hypokalemic and hypomagnesemic with prolonged QT.  She was started on IV fluids.  During hospitalization, she was switched to sodium bicarbonate drip IV because of acidosis.  Her diarrhea has much improved.  GI panel PCR is pending.  She is tolerating diet and hemodynamically stable.  She will be discharged home with outpatient follow-up with PCP.  Discharge Diagnoses:   Probable acute gastroenteritis -Presented with worsening nausea, vomiting and diarrhea for a week -Improving.  Tolerating advanced diet. Diarrhea is also improving.    GI panel PCR is pending.  Will hold off on CT of the abdomen for now. -Afebrile with no worsening leukocytosis.  Discharge patient home today.  Acute metabolic acidosis -Probably from diarrhea.  Currently on bicarb drip.  Bicarb 18 this morning. -Outpatient follow-up   prolonged QT interval -Probably from electrolyte imbalances.    Resolved on this morning's EKG.  Hypokalemia -Replace prior to discharge.  Hypomagnesemia -Replace prior to discharge today.  HIV -undetectable viral load with CD4 of 785 in 5/201 -Continue current antiretroviral regimen.  Outpatient follow-up with ID.  Hypertension -Blood  pressure stable.  Outpatient follow-up. Continue lisinopril  Discharge Instructions  Discharge Instructions    Diet - low sodium heart healthy   Complete by: As directed    Increase activity slowly   Complete by: As directed      Allergies as of 08/27/2019      Reactions   Dapsone Hives, Swelling   Penicillins Hives   Sulfamethoxazole-trimethoprim Swelling      Medication List    STOP taking these medications   doxycycline 100 MG capsule Commonly known as: VIBRAMYCIN   metroNIDAZOLE 500 MG tablet Commonly known as: FLAGYL   naproxen 375 MG tablet Commonly known as: NAPROSYN     TAKE these medications   acetaminophen 325 MG tablet Commonly known as: TYLENOL Take 650 mg by mouth every 6 (six) hours as needed for headache.   Denta 5000 Plus 1.1 % Crea dental cream Generic drug: sodium fluoride Place 1 application onto teeth at bedtime.   ibuprofen 200 MG tablet Commonly known as: ADVIL Take 400 mg by mouth daily as needed.   lisinopril 10 MG tablet Commonly known as: ZESTRIL Take 10 mg by mouth daily.   ondansetron 4 MG tablet Commonly known as: Zofran Take 1 tablet (4 mg total) by mouth every 8 (eight) hours as needed for nausea or vomiting.   Triumeq 600-50-300 MG tablet Generic drug: abacavir-dolutegravir-lamiVUDine Take 1 tablet by mouth daily.       Follow-up Information    Bailey, Ernst Bowlerhristopher B, MD. Schedule an appointment as soon as possible for a visit in 1 week(s).   Specialty: Infectious Diseases Why: with repeat cbc/bmp Contact information: [id number], [facility]  [place] [place] [id number] [phone]  Allergies  Allergen Reactions  . Dapsone Hives and Swelling  . Penicillins Hives  . Sulfamethoxazole-Trimethoprim Swelling    Consultations:  None   Procedures/Studies:  No results found.    Subjective: Patient seen and examined at bedside.  She feels much better.  Still having some  loose stools but it has also improved.  Tolerating diet.  Feels okay to go home today.  No overnight vomiting reported.  Discharge Exam: Vitals:   08/26/19 2220 08/27/19 0536  BP: 114/74 120/75  Pulse: 67 69  Resp: 18 18  Temp: 98.7 F (37.1 C) 98.2 F (36.8 C)  SpO2: 100% 99%    General: Pt is alert, awake, not in acute distress Cardiovascular: rate controlled, S1/S2 + Respiratory: bilateral decreased breath sounds at bases Abdominal: Soft, obese, NT, ND, bowel sounds + Extremities: Trace lower extremity edema present; no cyanosis    The results of significant diagnostics from this hospitalization (including imaging, microbiology, ancillary and laboratory) are listed below for reference.     Microbiology: Recent Results (from the past 240 hour(s))  SARS Coronavirus 2 by RT PCR (hospital order, performed in [facility] hospital lab) Nasopharyngeal Nasopharyngeal Swab     Status: None   Collection Time: 08/25/19  2:07 AM   Specimen: Nasopharyngeal Swab  Result Value Ref Range Status   SARS Coronavirus 2 NEGATIVE NEGATIVE Final    Comment: (NOTE) SARS-CoV-2 target nucleic acids are NOT DETECTED.  The SARS-CoV-2 RNA is generally detectable in upper and lower respiratory specimens during the acute phase of infection. The lowest concentration of SARS-CoV-2 viral copies this assay can detect is 250 copies / mL. A negative result does not preclude SARS-CoV-2 infection and should not be used as the sole basis for treatment or other patient management decisions.  A negative result may occur with improper specimen collection / handling, submission of specimen other than nasopharyngeal swab, presence of viral mutation(s) within the areas targeted by this assay, and inadequate number of viral copies (<250 copies / mL). A negative result must be combined with clinical observations, patient history, and epidemiological information.  Fact Sheet for Patients:    BoilerBrush.com.cy  Fact Sheet for Healthcare Providers: https://pope.com/  This test is not yet approved or  cleared by the [place] FDA and has been authorized for detection and/or diagnosis of SARS-CoV-2 by FDA under an Emergency Use Authorization (EUA).  This EUA will remain in effect (meaning this test can be used) for the duration of the COVID-19 declaration under Section 564(b)(1) of the Act, 21 U.S.C. section 360bbb-3(b)(1), unless the authorization is terminated or revoked sooner.  Performed at [facility], [address], [place], [place] [address]      Labs: BNP (last 3 results) No results for input(s): BNP in the last 8760 hours. Basic Metabolic Panel: Recent Labs  Lab 08/24/19 1837 08/25/19 0544 08/25/19 1435 08/26/19 0128 08/27/19 0411  NA 136 140 136 139 140  K 2.3* 2.4* 3.0* 3.5 2.6*  CL 109  --  114* 117* 111  CO2 11*  --  11* 11* 18*  GLUCOSE 164*  --  212* 106* 124*  BUN 29*  --  22* 17 12  CREATININE 1.52*  --  1.33* 1.33* 1.26*  CALCIUM 9.6  --  8.4* 8.8* 8.4*  MG 1.5*  --  1.8 1.7 1.2*  PHOS  --   --  1.8*  --   --    Liver Function Tests: Recent Labs  Lab 08/24/19 1837  08/25/19 1435 08/26/19 0128  AST 87*  --  62*  ALT 42  --  34  ALKPHOS 109  --  77  BILITOT 0.7  --  0.6  PROT 9.4*  --  7.0  ALBUMIN 3.8 3.0* 3.1*   Recent Labs  Lab 08/24/19 1837  LIPASE 99*   No results for input(s): AMMONIA in the last 168 hours. CBC: Recent Labs  Lab 08/24/19 1837 08/25/19 0544 08/26/19 0128 08/27/19 0411  WBC 6.1  --  5.7 4.8  NEUTROABS 3.1  --  2.3 1.4*  HGB 15.1* 15.0 11.9* 11.8*  HCT 41.9 44.0 33.7* 33.2*  MCV 91.3  --  95.7 94.6  PLT 401*  --  329 269   Cardiac Enzymes: No results for input(s): CKTOTAL, CKMB, CKMBINDEX, TROPONINI in the last 168 hours. BNP: Invalid input(s): POCBNP CBG: Recent Labs  Lab 08/25/19 0305  GLUCAP 146*   D-Dimer No results for  input(s): DDIMER in the last 72 hours. Hgb A1c Recent Labs    08/25/19 0344  HGBA1C 7.7*   Lipid Profile No results for input(s): CHOL, HDL, LDLCALC, TRIG, CHOLHDL, LDLDIRECT in the last 72 hours. Thyroid function studies Recent Labs    08/25/19 1435  TSH 2.658   Anemia work up No results for input(s): VITAMINB12, FOLATE, FERRITIN, TIBC, IRON, RETICCTPCT in the last 72 hours. Urinalysis    Component Value Date/Time   COLORURINE YELLOW 08/25/2019 0826   APPEARANCEUR CLOUDY (A) 08/25/2019 0826   LABSPEC 1.008 08/25/2019 0826   PHURINE 6.0 08/25/2019 0826   GLUCOSEU NEGATIVE 08/25/2019 0826   HGBUR MODERATE (A) 08/25/2019 0826   BILIRUBINUR NEGATIVE 08/25/2019 0826   KETONESUR NEGATIVE 08/25/2019 0826   PROTEINUR 30 (A) 08/25/2019 0826   NITRITE NEGATIVE 08/25/2019 0826   LEUKOCYTESUR LARGE (A) 08/25/2019 0826   Sepsis Labs Invalid input(s): PROCALCITONIN,  WBC,  LACTICIDVEN Microbiology Recent Results (from the past 240 hour(s))  SARS Coronavirus 2 by RT PCR (hospital order, performed in [facility] hospital lab) Nasopharyngeal Nasopharyngeal Swab     Status: None   Collection Time: 08/25/19  2:07 AM   Specimen: Nasopharyngeal Swab  Result Value Ref Range Status   SARS Coronavirus 2 NEGATIVE NEGATIVE Final    Comment: (NOTE) SARS-CoV-2 target nucleic acids are NOT DETECTED.  The SARS-CoV-2 RNA is generally detectable in upper and lower respiratory specimens during the acute phase of infection. The lowest concentration of SARS-CoV-2 viral copies this assay can detect is 250 copies / mL. A negative result does not preclude SARS-CoV-2 infection and should not be used as the sole basis for treatment or other patient management decisions.  A negative result may occur with improper specimen collection / handling, submission of specimen other than nasopharyngeal swab, presence of viral mutation(s) within the areas targeted by this assay, and inadequate number of viral  copies (<250 copies / mL). A negative result must be combined with clinical observations, patient history, and epidemiological information.  Fact Sheet for Patients:   BoilerBrush.com.cy  Fact Sheet for Healthcare Providers: https://pope.com/  This test is not yet approved or  cleared by the [place] FDA and has been authorized for detection and/or diagnosis of SARS-CoV-2 by FDA under an Emergency Use Authorization (EUA).  This EUA will remain in effect (meaning this test can be used) for the duration of the COVID-19 declaration under Section 564(b)(1) of the Act, 21 U.S.C. section 360bbb-3(b)(1), unless the authorization is terminated or revoked sooner.  Performed at [facility], [address] [place], [place] [address]      Time coordinating discharge: 35 minutes  SIGNED:   Glade Lloyd, MD  [facility] 08/27/2019, 9:16 AM

## 2020-07-23 ENCOUNTER — Other Ambulatory Visit: Payer: Self-pay | Admitting: Infectious Diseases

## 2020-07-25 ENCOUNTER — Other Ambulatory Visit: Payer: Self-pay | Admitting: Infectious Diseases

## 2020-07-25 DIAGNOSIS — B20 Human immunodeficiency virus [HIV] disease: Secondary | ICD-10-CM

## 2021-06-13 IMAGING — DX DG ELBOW COMPLETE 3+V*R*
4 series · 4 of 4 positions shown · non-contrast
Comparison: None.

CLINICAL DATA: Dog bite

EXAM:
RIGHT ELBOW - COMPLETE 3+ VIEW

[elbow ap]
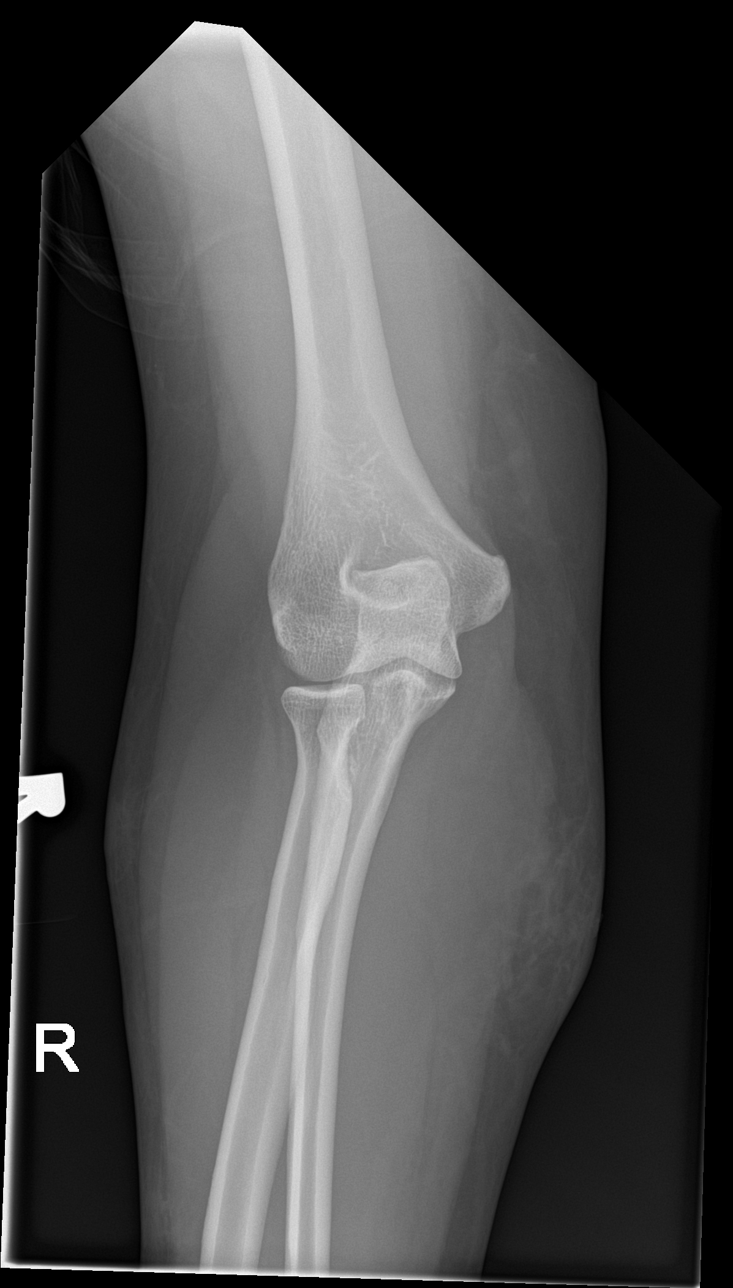

[elbow obl (1 of 2)]
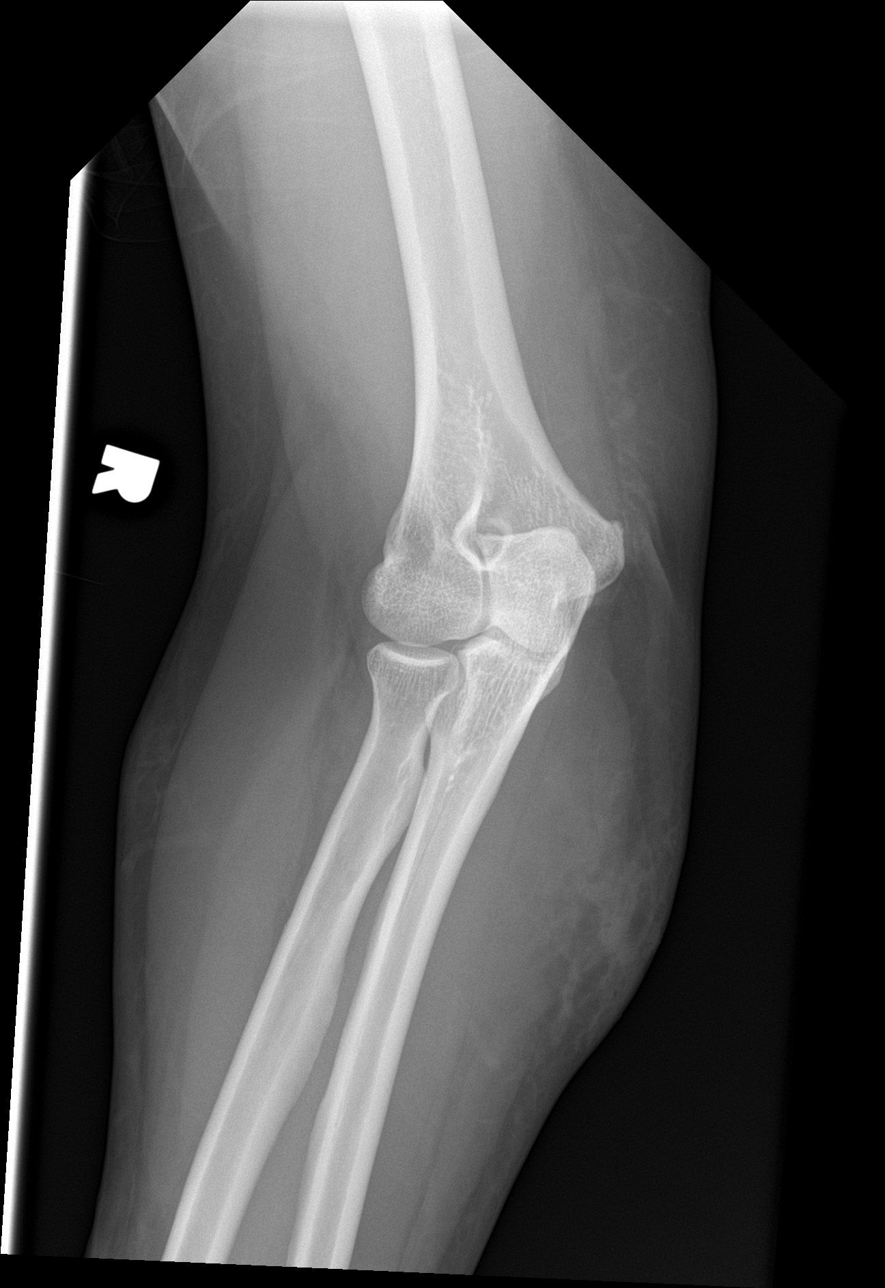

[elbow obl (2 of 2)]
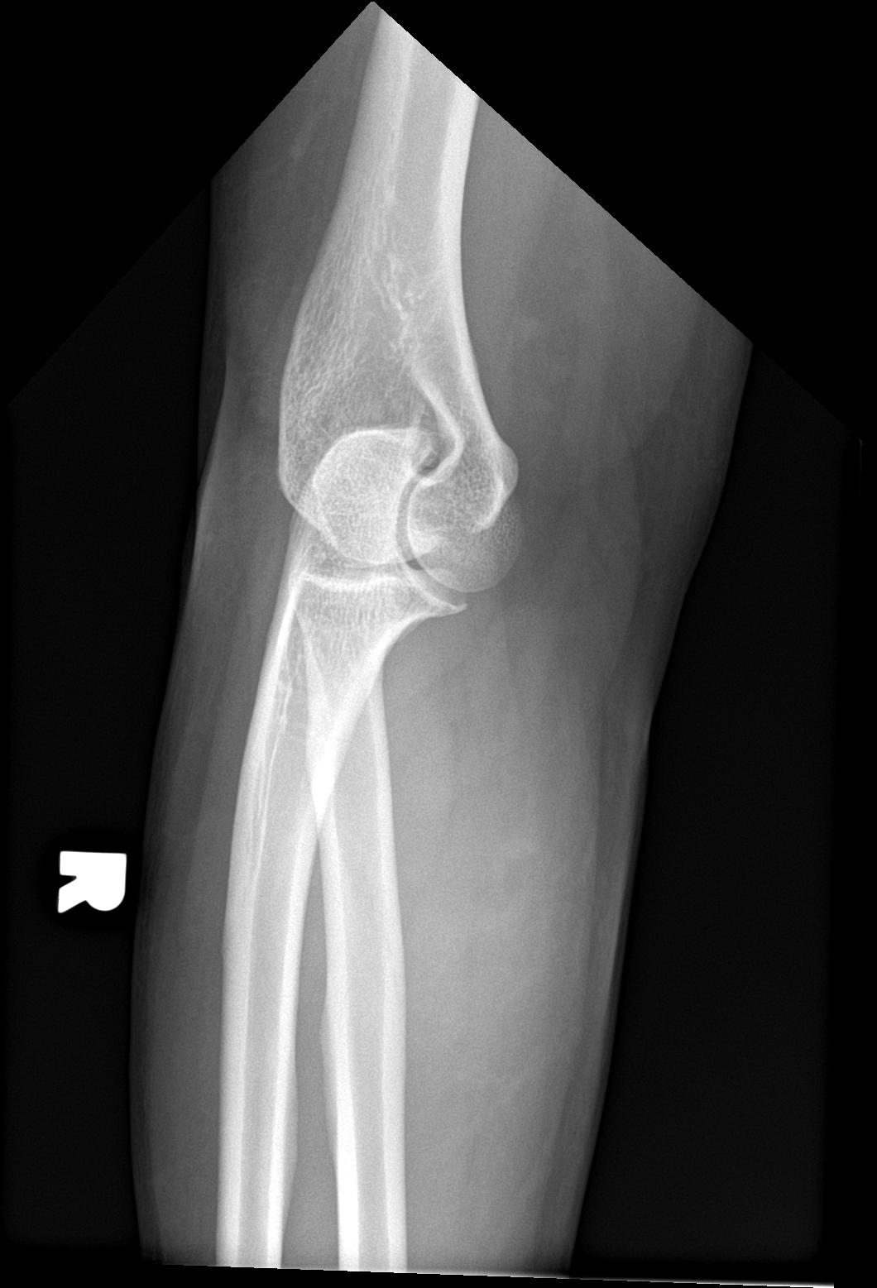

[elbow lat]
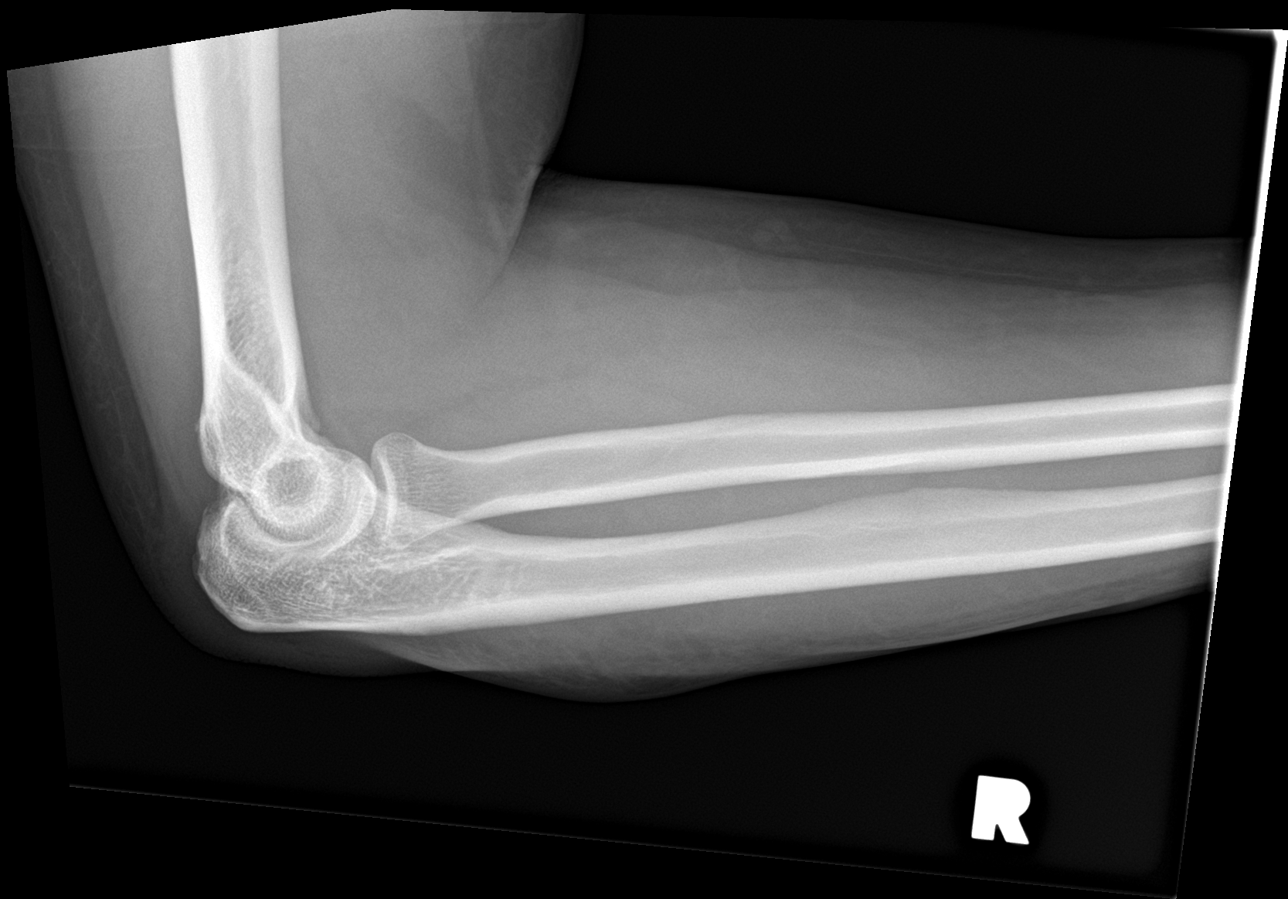

[4 of 4 positions shown; findings below may reference images not displayed]

FINDINGS: There is no evidence of fracture, dislocation, or joint effusion. No
radiopaque foreign body. There is focal laceration with soft tissue
swelling seen over the ulnar posterior aspect of the proximal
forearm.
IMPRESSION: No acute osseous abnormality or radiopaque foreign body.

## 2021-06-13 IMAGING — DX DG FOREARM 2V*R*
2 series · 2 of 2 positions shown · non-contrast
Comparison: None.

CLINICAL DATA: Dog bite

EXAM:
RIGHT FOREARM - 2 VIEW

[forearm ap]
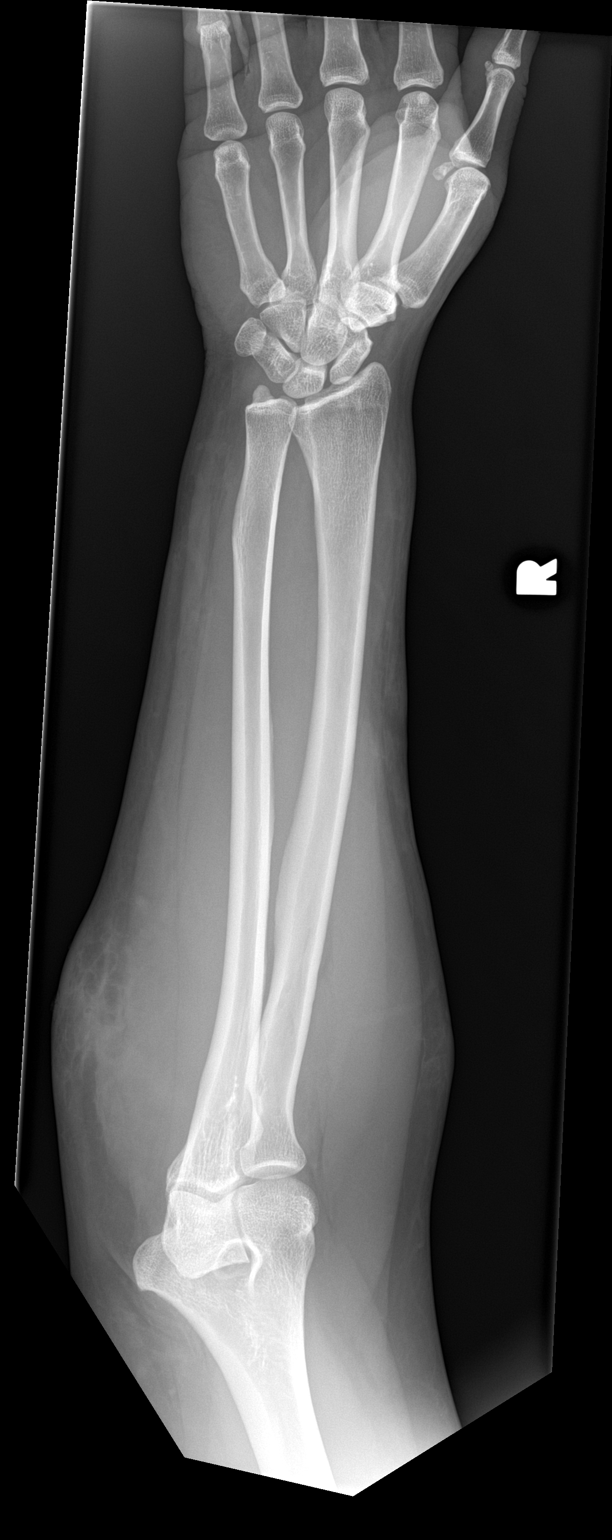

[forearm lat]
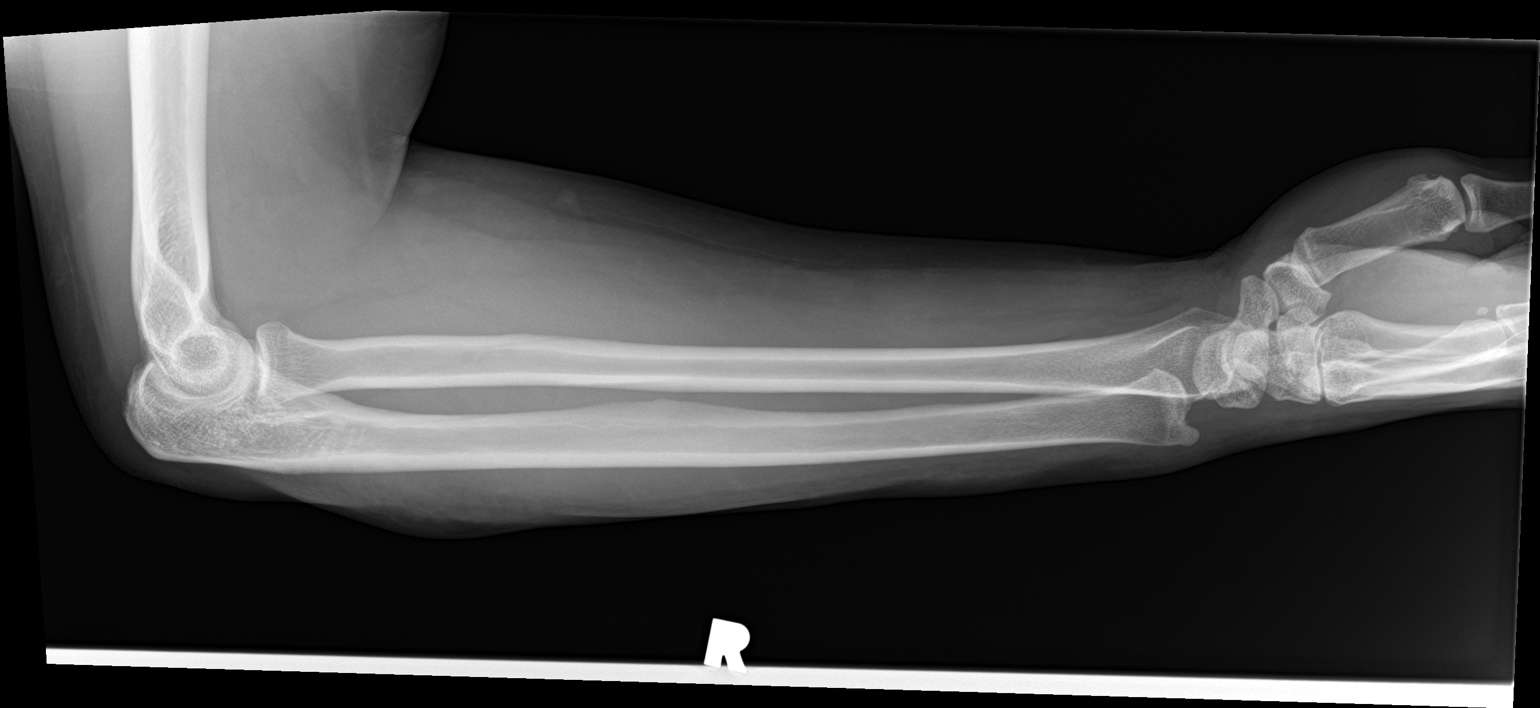

[2 of 2 positions shown; findings below may reference images not displayed]

FINDINGS: No fracture or dislocation. No radiopaque foreign body. Soft tissue
swelling with focal laceration seen overlying the ulnar aspect of
the proximal forearm. There is also a small soft tissue laceration
seen overlying the distal radius.
IMPRESSION: No osseous injury or radiopaque foreign body.

## 2021-06-26 ENCOUNTER — Other Ambulatory Visit: Payer: Self-pay | Admitting: Infectious Diseases

## 2021-06-26 DIAGNOSIS — Z1231 Encounter for screening mammogram for malignant neoplasm of breast: Secondary | ICD-10-CM

## 2023-07-13 DIAGNOSIS — N2581 Secondary hyperparathyroidism of renal origin: Secondary | ICD-10-CM | POA: Diagnosis not present

## 2023-07-13 DIAGNOSIS — N051 Unspecified nephritic syndrome with focal and segmental glomerular lesions: Secondary | ICD-10-CM | POA: Diagnosis not present

## 2023-07-13 DIAGNOSIS — E559 Vitamin D deficiency, unspecified: Secondary | ICD-10-CM | POA: Diagnosis not present

## 2023-07-13 DIAGNOSIS — N184 Chronic kidney disease, stage 4 (severe): Secondary | ICD-10-CM | POA: Diagnosis not present

## 2023-07-13 DIAGNOSIS — R809 Proteinuria, unspecified: Secondary | ICD-10-CM | POA: Diagnosis not present

## 2023-07-13 DIAGNOSIS — D631 Anemia in chronic kidney disease: Secondary | ICD-10-CM | POA: Diagnosis not present

## 2023-07-13 DIAGNOSIS — E611 Iron deficiency: Secondary | ICD-10-CM | POA: Diagnosis not present

## 2023-07-20 DIAGNOSIS — E559 Vitamin D deficiency, unspecified: Secondary | ICD-10-CM | POA: Diagnosis not present

## 2023-07-20 DIAGNOSIS — N184 Chronic kidney disease, stage 4 (severe): Secondary | ICD-10-CM | POA: Diagnosis not present

## 2023-07-20 DIAGNOSIS — I10 Essential (primary) hypertension: Secondary | ICD-10-CM | POA: Diagnosis not present

## 2023-07-20 DIAGNOSIS — N051 Unspecified nephritic syndrome with focal and segmental glomerular lesions: Secondary | ICD-10-CM | POA: Diagnosis not present

## 2023-07-20 DIAGNOSIS — N2581 Secondary hyperparathyroidism of renal origin: Secondary | ICD-10-CM | POA: Diagnosis not present

## 2023-07-23 DIAGNOSIS — E1122 Type 2 diabetes mellitus with diabetic chronic kidney disease: Secondary | ICD-10-CM | POA: Diagnosis not present

## 2023-07-23 DIAGNOSIS — N184 Chronic kidney disease, stage 4 (severe): Secondary | ICD-10-CM | POA: Diagnosis not present

## 2023-07-23 DIAGNOSIS — Z9189 Other specified personal risk factors, not elsewhere classified: Secondary | ICD-10-CM | POA: Diagnosis not present

## 2023-07-23 DIAGNOSIS — K7469 Other cirrhosis of liver: Secondary | ICD-10-CM | POA: Diagnosis not present

## 2023-07-23 DIAGNOSIS — N051 Unspecified nephritic syndrome with focal and segmental glomerular lesions: Secondary | ICD-10-CM | POA: Diagnosis not present

## 2023-07-23 DIAGNOSIS — Z23 Encounter for immunization: Secondary | ICD-10-CM | POA: Diagnosis not present

## 2023-07-23 DIAGNOSIS — N838 Other noninflammatory disorders of ovary, fallopian tube and broad ligament: Secondary | ICD-10-CM | POA: Diagnosis not present

## 2023-07-23 DIAGNOSIS — Z79899 Other long term (current) drug therapy: Secondary | ICD-10-CM | POA: Diagnosis not present

## 2023-07-23 DIAGNOSIS — B20 Human immunodeficiency virus [HIV] disease: Secondary | ICD-10-CM | POA: Diagnosis not present

## 2023-07-23 DIAGNOSIS — N3946 Mixed incontinence: Secondary | ICD-10-CM | POA: Diagnosis not present

## 2023-07-31 DIAGNOSIS — N184 Chronic kidney disease, stage 4 (severe): Secondary | ICD-10-CM | POA: Diagnosis not present

## 2023-07-31 DIAGNOSIS — E1122 Type 2 diabetes mellitus with diabetic chronic kidney disease: Secondary | ICD-10-CM | POA: Diagnosis not present

## 2023-07-31 DIAGNOSIS — N182 Chronic kidney disease, stage 2 (mild): Secondary | ICD-10-CM | POA: Diagnosis not present

## 2023-08-17 DIAGNOSIS — Z1231 Encounter for screening mammogram for malignant neoplasm of breast: Secondary | ICD-10-CM | POA: Diagnosis not present

## 2023-08-17 DIAGNOSIS — K760 Fatty (change of) liver, not elsewhere classified: Secondary | ICD-10-CM | POA: Diagnosis not present

## 2023-08-17 DIAGNOSIS — E1122 Type 2 diabetes mellitus with diabetic chronic kidney disease: Secondary | ICD-10-CM | POA: Diagnosis not present

## 2023-08-17 DIAGNOSIS — L853 Xerosis cutis: Secondary | ICD-10-CM | POA: Diagnosis not present

## 2023-08-17 DIAGNOSIS — N184 Chronic kidney disease, stage 4 (severe): Secondary | ICD-10-CM | POA: Diagnosis not present

## 2023-10-21 DIAGNOSIS — Z1231 Encounter for screening mammogram for malignant neoplasm of breast: Secondary | ICD-10-CM | POA: Diagnosis not present

## 2023-10-30 DIAGNOSIS — E1122 Type 2 diabetes mellitus with diabetic chronic kidney disease: Secondary | ICD-10-CM | POA: Diagnosis not present

## 2023-10-30 DIAGNOSIS — N184 Chronic kidney disease, stage 4 (severe): Secondary | ICD-10-CM | POA: Diagnosis not present
